# Patient Record
Sex: Female | Born: 1970
Health system: Southern US, Community
[De-identification: ages and names within clinical notes are randomized; demographics above are authoritative.]

## PROBLEM LIST (undated history)

## (undated) DIAGNOSIS — T7840XA Allergy, unspecified, initial encounter: Secondary | ICD-10-CM

## (undated) DIAGNOSIS — N809 Endometriosis, unspecified: Secondary | ICD-10-CM

## (undated) DIAGNOSIS — IMO0002 Reserved for concepts with insufficient information to code with codable children: Secondary | ICD-10-CM

## (undated) DIAGNOSIS — F32A Depression, unspecified: Secondary | ICD-10-CM

## (undated) DIAGNOSIS — H33109 Unspecified retinoschisis, unspecified eye: Secondary | ICD-10-CM

## (undated) DIAGNOSIS — F419 Anxiety disorder, unspecified: Secondary | ICD-10-CM

## (undated) DIAGNOSIS — N200 Calculus of kidney: Secondary | ICD-10-CM

## (undated) DIAGNOSIS — E785 Hyperlipidemia, unspecified: Secondary | ICD-10-CM

## (undated) DIAGNOSIS — F329 Major depressive disorder, single episode, unspecified: Secondary | ICD-10-CM

## (undated) HISTORY — PX: PELVIC LAPAROSCOPY: SHX162

## (undated) HISTORY — DX: Allergy, unspecified, initial encounter: T78.40XA

## (undated) HISTORY — DX: Calculus of kidney: N20.0

## (undated) HISTORY — DX: Reserved for concepts with insufficient information to code with codable children: IMO0002

## (undated) HISTORY — DX: Endometriosis, unspecified: N80.9

## (undated) HISTORY — PX: AUGMENTATION MAMMAPLASTY: SUR837

## (undated) HISTORY — PX: OTHER SURGICAL HISTORY: SHX169

## (undated) HISTORY — PX: WISDOM TOOTH EXTRACTION: SHX21

## (undated) HISTORY — DX: Depression, unspecified: F32.A

## (undated) HISTORY — DX: Unspecified retinoschisis, unspecified eye: H33.109

## (undated) HISTORY — DX: Hyperlipidemia, unspecified: E78.5

## (undated) HISTORY — DX: Anxiety disorder, unspecified: F41.9

## (undated) HISTORY — PX: TYMPANOSTOMY TUBE PLACEMENT: SHX32

---
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
## 1999-02-13 ENCOUNTER — Other Ambulatory Visit: Admission: RE | Admit: 1999-02-13 | Discharge: 1999-02-13 | Payer: Self-pay | Admitting: Gynecology

## 1999-04-22 ENCOUNTER — Inpatient Hospital Stay (HOSPITAL_COMMUNITY): Admission: AD | Admit: 1999-04-22 | Discharge: 1999-04-22 | Payer: Self-pay | Admitting: Gynecology

## 1999-04-23 ENCOUNTER — Inpatient Hospital Stay (HOSPITAL_COMMUNITY): Admission: AD | Admit: 1999-04-23 | Discharge: 1999-04-23 | Payer: Self-pay | Admitting: Gynecology

## 1999-08-12 ENCOUNTER — Inpatient Hospital Stay (HOSPITAL_COMMUNITY): Admission: AD | Admit: 1999-08-12 | Discharge: 1999-08-14 | Payer: Self-pay | Admitting: Gynecology

## 2000-07-26 ENCOUNTER — Encounter (INDEPENDENT_AMBULATORY_CARE_PROVIDER_SITE_OTHER): Payer: Self-pay | Admitting: Specialist

## 2000-07-26 ENCOUNTER — Ambulatory Visit: Admission: AD | Admit: 2000-07-26 | Discharge: 2000-07-26 | Payer: Self-pay | Admitting: Gynecology

## 2001-09-07 DIAGNOSIS — N809 Endometriosis, unspecified: Secondary | ICD-10-CM | POA: Insufficient documentation

## 2001-10-20 ENCOUNTER — Inpatient Hospital Stay (HOSPITAL_COMMUNITY): Admission: AD | Admit: 2001-10-20 | Discharge: 2001-10-22 | Payer: Self-pay | Admitting: Gynecology

## 2001-10-20 ENCOUNTER — Encounter (INDEPENDENT_AMBULATORY_CARE_PROVIDER_SITE_OTHER): Payer: Self-pay

## 2001-12-07 ENCOUNTER — Other Ambulatory Visit: Admission: RE | Admit: 2001-12-07 | Discharge: 2001-12-07 | Payer: Self-pay | Admitting: Obstetrics and Gynecology

## 2003-01-17 ENCOUNTER — Other Ambulatory Visit: Admission: RE | Admit: 2003-01-17 | Discharge: 2003-01-17 | Payer: Self-pay | Admitting: Obstetrics and Gynecology

## 2004-01-28 ENCOUNTER — Other Ambulatory Visit: Admission: RE | Admit: 2004-01-28 | Discharge: 2004-01-28 | Payer: Self-pay | Admitting: Obstetrics and Gynecology

## 2005-02-18 ENCOUNTER — Other Ambulatory Visit: Admission: RE | Admit: 2005-02-18 | Discharge: 2005-02-18 | Payer: Self-pay | Admitting: Obstetrics and Gynecology

## 2006-03-01 ENCOUNTER — Other Ambulatory Visit: Admission: RE | Admit: 2006-03-01 | Discharge: 2006-03-01 | Payer: Self-pay | Admitting: Obstetrics and Gynecology

## 2007-03-03 ENCOUNTER — Other Ambulatory Visit: Admission: RE | Admit: 2007-03-03 | Discharge: 2007-03-03 | Payer: Self-pay | Admitting: Obstetrics and Gynecology

## 2008-03-06 ENCOUNTER — Other Ambulatory Visit: Admission: RE | Admit: 2008-03-06 | Discharge: 2008-03-06 | Payer: Self-pay | Admitting: Obstetrics and Gynecology

## 2009-03-14 ENCOUNTER — Encounter: Payer: Self-pay | Admitting: Obstetrics and Gynecology

## 2009-03-14 ENCOUNTER — Ambulatory Visit: Payer: Self-pay | Admitting: Obstetrics and Gynecology

## 2009-03-14 ENCOUNTER — Other Ambulatory Visit: Admission: RE | Admit: 2009-03-14 | Discharge: 2009-03-14 | Payer: Self-pay | Admitting: Obstetrics and Gynecology

## 2009-12-17 ENCOUNTER — Ambulatory Visit: Payer: Self-pay | Admitting: Obstetrics and Gynecology

## 2009-12-19 ENCOUNTER — Encounter: Admission: RE | Admit: 2009-12-19 | Discharge: 2009-12-19 | Payer: Self-pay | Admitting: Obstetrics and Gynecology

## 2010-03-18 ENCOUNTER — Other Ambulatory Visit: Admission: RE | Admit: 2010-03-18 | Discharge: 2010-03-18 | Payer: Self-pay | Admitting: Obstetrics and Gynecology

## 2010-03-18 ENCOUNTER — Ambulatory Visit: Payer: Self-pay | Admitting: Obstetrics and Gynecology

## 2011-01-23 NOTE — Discharge Summary (Signed)
[facility]  Patient:    Dominique Powell, Dominique Powell Visit Number: [id number] MRN: [id number]          Service Type: OBS Location: 910A [id number] [id number] Attending Physician:  Douglass Rivers Dictated by:   Antony Contras, [facility] Admit Date:  10/20/2001 Discharge Date: 10/22/2001                             Discharge Summary  DISCHARGE DIAGNOSES:          1. Intrauterine pregnancy at 39-6/7 weeks.                               2. Spontaneous rupture of membranes.  PROCEDURE:                    Normal spontaneous vaginal delivery of viable infant over intact perineum, with repair of second-degree laceration.  HISTORY OF PRESENT ILLNESS:   The patient is a 40 year old gravida 2, para 1-0-0-1 with an LMP of Jan 13, 2001.  [facility] October 21, 2001.  PRENATAL RISK FACTORS:        History of preterm labor.  The patients husband has a history of benign renal cyst, but not polycystic.  LABS:                         Blood type 0 negative, antibody screen negative. RPR, HbSaG, HIV nonreactive.  The patient was also maintained on terbutaline 2.5 mg p.o. q.4h.  HOSPITAL COURSE/TREATMENT:    The patient was admitted on October 20 2001 at 39-6/7 weeks, with spontaneous rupture of membranes.  She did progress to complete dilatation.  Delivered an Apgar 8/8 female infant, weighing 8 pounds 2 ounces over an intact perineum, with a second-degree laceration.  POSTPARTUM COURSE:            She had no difficulty voiding and was able to be discharged in satisfactory condition on her second postpartum day.  DISCHARGE LABS:               Baby is Rh negative, so patient did not need to receive RhoGAM.  CBC -- Hematocrit 32.8, hemoglobin 11.6, WBC 13.4, platelets 221.  DISPOSITION:                  Follow-up in six weeks.  Continue with prenatal vitamins and iron.  Motrin and Tylox for pain. Dictated by:   Antony Contras, [facility] Attending Physician:  Douglass Rivers DD:  11/10/01 TD:   11/11/01 Job: [id number]

## 2011-01-23 NOTE — Op Note (Signed)
[facility]  Patient:    Dominique Powell, Dominique Powell                     MRN: [id number] Proc. Date: 07/26/00 Adm. Date:  [id number] Attending:  Sharon Mt                           Operative Report  PREOPERATIVE DIAGNOSIS: 1. Dyspareunia. 2. Vaginal lesions. 3. Endometriosis.  POSTOPERATIVE DIAGNOSIS: 1. Dyspareunia. 2. Vaginal lesions. 3. Endometriosis.  OPERATION:                    Diagnostic laparoscopy with laser vaporization of endometriosis, followed by vaginal laser vaporization of endometriosis.  SURGEON:                      Daniel L. Eda Paschal, M.D.  ASSISTANT:                    Douglass Rivers, M.D.  FINDINGS:                     At the time of laparoscopy, the patient had multiple areas of endometriosis.  Lesions were red, white and pigmented.  They were more superficial than deep, except the area involving the left uterosacral ligaments.  The patient had lesions throughout a cul-de-sac on the vaginal part of the peritoneum, several small ones on the right ovary on the broad ligament posteriorly.  Total number of areas involved were approximately 10.  In addition to all of the above lesions, the patient had a very bad area involving her left uterosacral ligament, the ureter and the sigmoid colon. The patients fallopian tubes were normal with luxuriant fimbria.  The patient had very little pelvic adhesive disease, except as noted above.  There was in addition a last area involving the sigmoid colon and the left round ligament, with active disease as well as adhesive disease.  DESCRIPTION OF PROCEDURE:     After adequate general endotracheal anesthesia, the patient was placed in the dorsolithotomy position; prepped and draped in the usual sterile manner.  The bladder was emptied with a Robinson catheter. A pneumoperitoneum was created with a Veress needle.  A 10 mm trocar was placed subumbilically; through that the operating  laparoscope attached to the camera was placed.  Two ports were placed in the pelvis (5 mm ports; one suprapubic and one left lower quadrant).  Through this a variety of instruments were placed.  The patients pelvis was systematically evaluated on each side using an irrigator/aspirator being utilized throughout the procedure.  The Nd:YAG laser was utilized with a GR6 tip on top of a Behr fiber at 10 watts power.  Areas of endometriosis were lasered, after documentation had been done with the camera.  Almost all the areas were completely laser vaporized until they were completely gone.  The area where the sigmoid colon was adherent to the ureter and the uterosacral ligament was lasered exceedingly carefully to avoid injury.  It was not felt that there had been any injury; however, because of the adherence and the fact that you could not get in retroperitoneally and free up the ureter because of the denseness of the adherence, it is possible that a small amount of disease was left on the left uterosacral ligament.  In addition, the colon was almost completely freed up from this area, but it could not be  completely freed up because the line of bowel could not be carefully followed, even lasering away from it and trying to strip the bowel off.  At termination of the procedure it was felt that at least 95% of the above had been removed.  Copious irrigation was done with Ringers lactate.  There was no bleeding noted at the termination of the procedure.  Several times with the bipolar coagulator was utilized to control bleeding.  The pneumoperitoneum was evacuated.  The fascial incisions and some above were closed with 0 Vicryl, and other incisions were closed with 4-0 Monocryl.  Attention was next turned to the vaginal lesions.  They were carefully exposed, and using the Nd:YAG lager at 8 watts power it could all be laser vaporized.  One was pigmented and the others were red.  There  were approximately 5-6 lesions.  When we were finished they were completely gone.  ESTIMATED BLOOD LOSS:         Less than 50 cc with no blood replaced.  DISPOSITION:                  The patient tolerated the procedure well and left the operating room in satisfactory condition. DD:  07/26/00 TD:  07/26/00 Job: [id number]

## 2011-03-22 ENCOUNTER — Encounter: Payer: Self-pay | Admitting: Obstetrics and Gynecology

## 2011-03-23 ENCOUNTER — Other Ambulatory Visit: Payer: Self-pay | Admitting: Obstetrics and Gynecology

## 2011-03-23 ENCOUNTER — Encounter (INDEPENDENT_AMBULATORY_CARE_PROVIDER_SITE_OTHER): Payer: 59 | Admitting: Obstetrics and Gynecology

## 2011-03-23 ENCOUNTER — Other Ambulatory Visit (HOSPITAL_COMMUNITY)
Admission: RE | Admit: 2011-03-23 | Discharge: 2011-03-23 | Disposition: A | Discharge status: Home / Self Care | Payer: 59 | Source: Ambulatory Visit | Attending: Obstetrics and Gynecology | Admitting: Obstetrics and Gynecology

## 2011-03-23 DIAGNOSIS — Z124 Encounter for screening for malignant neoplasm of cervix: Secondary | ICD-10-CM | POA: Insufficient documentation

## 2011-03-23 DIAGNOSIS — Z13220 Encounter for screening for lipoid disorders: Secondary | ICD-10-CM

## 2011-03-23 DIAGNOSIS — Z01419 Encounter for gynecological examination (general) (routine) without abnormal findings: Secondary | ICD-10-CM

## 2011-03-23 DIAGNOSIS — R82998 Other abnormal findings in urine: Secondary | ICD-10-CM

## 2011-05-13 ENCOUNTER — Other Ambulatory Visit: Payer: Self-pay | Admitting: Obstetrics and Gynecology

## 2011-05-13 DIAGNOSIS — Z1231 Encounter for screening mammogram for malignant neoplasm of breast: Secondary | ICD-10-CM

## 2011-05-18 ENCOUNTER — Other Ambulatory Visit: Payer: Self-pay | Admitting: Obstetrics and Gynecology

## 2011-05-18 ENCOUNTER — Ambulatory Visit
Admission: RE | Admit: 2011-05-18 | Discharge: 2011-05-18 | Disposition: A | Discharge status: Home / Self Care | Payer: 59 | Source: Ambulatory Visit | Attending: Obstetrics and Gynecology | Admitting: Obstetrics and Gynecology

## 2011-05-18 DIAGNOSIS — Z1231 Encounter for screening mammogram for malignant neoplasm of breast: Secondary | ICD-10-CM

## 2011-09-22 ENCOUNTER — Other Ambulatory Visit: Payer: Self-pay | Admitting: *Deleted

## 2011-09-22 MED ORDER — LEUPROLIDE ACETATE (3 MONTH) 11.25 MG IM KIT: 11.2500 mg | PACK | INTRAMUSCULAR | Status: DC

## 2011-09-22 MED ORDER — ESTRADIOL 1 MG PO TABS: 1.0000 mg | ORAL_TABLET | Freq: Every day | ORAL | Status: DC

## 2012-03-01 ENCOUNTER — Ambulatory Visit (INDEPENDENT_AMBULATORY_CARE_PROVIDER_SITE_OTHER): Payer: 59 | Admitting: Pharmacist

## 2012-03-01 ENCOUNTER — Encounter: Payer: Self-pay | Admitting: Pharmacist

## 2012-03-01 VITALS — BP 98/67 | HR 84 | Ht 64.5 in | Wt 129.9 lb

## 2012-03-01 DIAGNOSIS — N809 Endometriosis, unspecified: Secondary | ICD-10-CM

## 2012-03-01 DIAGNOSIS — J309 Allergic rhinitis, unspecified: Secondary | ICD-10-CM

## 2012-03-01 NOTE — Progress Notes (Signed)
Patient ID: [name], female   DOB: 1971/05/22, 41 y.o.   MRN: [id number] Reviewed and agree with Dr. Macky Lower management

## 2012-03-01 NOTE — Progress Notes (Signed)
  Subjective:    Patient ID: Dominique Powell, female    DOB: June 13, 1971, 41 y.o.   MRN: [id number]  HPI Patient arrives in good spirits for medication review.  Reports seeing Dr. Edyth Gunnels as primary care provider/gyneocologist. Reports being diagnosed with endometriosis for 10 years and states she is under acceptable level of control.     Review of Systems     Objective:   Physical Exam        Assessment & Plan:  Following medication review, no suggestions for change.  Complete medication list provided to patient.  Total time in face to face medication review: 20 minutes.  Patient seen with: Brett Fairy, PharmD, Pharmacy Resident.

## 2012-04-06 ENCOUNTER — Encounter: Payer: Self-pay | Admitting: Gynecology

## 2012-04-06 DIAGNOSIS — N200 Calculus of kidney: Secondary | ICD-10-CM | POA: Insufficient documentation

## 2012-04-06 DIAGNOSIS — H33109 Unspecified retinoschisis, unspecified eye: Secondary | ICD-10-CM | POA: Insufficient documentation

## 2012-04-06 DIAGNOSIS — IMO0002 Reserved for concepts with insufficient information to code with codable children: Secondary | ICD-10-CM | POA: Insufficient documentation

## 2012-04-11 ENCOUNTER — Encounter: Payer: 59 | Admitting: Obstetrics and Gynecology

## 2012-04-14 ENCOUNTER — Other Ambulatory Visit: Payer: Self-pay | Admitting: Obstetrics and Gynecology

## 2012-04-14 ENCOUNTER — Telehealth: Payer: Self-pay | Admitting: Obstetrics and Gynecology

## 2012-04-14 ENCOUNTER — Ambulatory Visit (INDEPENDENT_AMBULATORY_CARE_PROVIDER_SITE_OTHER): Payer: 59 | Admitting: Obstetrics and Gynecology

## 2012-04-14 ENCOUNTER — Encounter: Payer: Self-pay | Admitting: Obstetrics and Gynecology

## 2012-04-14 VITALS — BP 112/64 | Ht 64.0 in | Wt 126.0 lb

## 2012-04-14 DIAGNOSIS — Z3049 Encounter for surveillance of other contraceptives: Secondary | ICD-10-CM

## 2012-04-14 DIAGNOSIS — Z01419 Encounter for gynecological examination (general) (routine) without abnormal findings: Secondary | ICD-10-CM

## 2012-04-14 LAB — CBC WITH DIFFERENTIAL/PLATELET
Basophils Absolute: 0 10*3/uL (ref 0.0–0.1)
Eosinophils Absolute: 0.1 10*3/uL (ref 0.0–0.7)
Lymphocytes Relative: 37 % (ref 12–46)
Lymphs Abs: 1.5 10*3/uL (ref 0.7–4.0)
MCH: 30.5 pg (ref 26.0–34.0)
MCHC: 34.7 g/dL (ref 30.0–36.0)
Neutro Abs: 2 10*3/uL (ref 1.7–7.7)

## 2012-04-14 MED ORDER — LEVONORGESTREL 20 MCG/24HR IU IUD: INTRAUTERINE_SYSTEM | Freq: Once | INTRAUTERINE | Status: DC

## 2012-04-14 NOTE — Patient Instructions (Signed)
We will let you know when you're approved for a Mirena IUD.

## 2012-04-14 NOTE — Telephone Encounter (Signed)
Patient informed that insurance covers Mirena IUD 100% but the insertion and removal are applied to her deductible.  She has a $400 ded and has met -0-.  Her cost for the visit will be the insertion fee of $220.  Appt scheduled.

## 2012-04-14 NOTE — Progress Notes (Signed)
Patient came to see me today for her annual GYN exam. She has now been on Depo-Lupron with add back estrogen for approximately 6 years. It has kept her asymptomatic from her endometriosis which was dysmenorrhea, menorrhagia and dyspareunia due to vaginal implants at the top of the vagina. She is also used a Mirena IUD for birth control. It is actually overdue to be changed. She is giving some thought to stopping the Lupron to see if her symptoms persist. She has noticed some low diminished libido and vaginal dryness with the Lupron. She wanted my opinion. She is having no vaginal bleeding. She is having no pelvic pain. She is up-to-date on mammograms. She has never had an abnormal Pap smear. Her last Pap smear was 2012.  Physical examination: Kennon Portela present. HEENT within normal limits. Neck: Thyroid not large. No masses. Supraclavicular nodes: not enlarged. Breasts: Examined in both sitting and lying  position. No skin changes and no masses. Abdomen: Soft no guarding rebound or masses or hernia. Pelvic: External: Within normal limits except for 3 mm sebaceous cyst on left labia which is stable. BUS: Within normal limits. Vaginal:within normal limits. Good estrogen effect. No evidence of cystocele rectocele or enterocele. Cervix: clean IUD string visible. Uterus: Normal size and shape. Adnexa: No masses. Rectovaginal exam: Confirmatory and negative. Extremities: Within normal limits.  Assessment: Endometriosis-asymptomatic on Depo-Lupron  Plan: Discussed options. We have elected to stop her Depo-Lupron and add back and replaced her Mirena IUD. Hopefully this will be enough to keep her asymptomatic. If not we could either reinitiate Depo-Lupron or consider hysterectomy. Continue yearly mammograms.The new Pap smear guidelines were discussed with the patient. No pap done.

## 2012-04-15 ENCOUNTER — Other Ambulatory Visit: Payer: Self-pay | Admitting: Obstetrics and Gynecology

## 2012-04-15 DIAGNOSIS — E7800 Pure hypercholesterolemia, unspecified: Secondary | ICD-10-CM

## 2012-04-15 LAB — URINALYSIS W MICROSCOPIC + REFLEX CULTURE
Bacteria, UA: NONE SEEN
Bilirubin Urine: NEGATIVE
Casts: NONE SEEN
Crystals: NONE SEEN
Glucose, UA: NEGATIVE mg/dL
Hgb urine dipstick: NEGATIVE
Protein, ur: NEGATIVE mg/dL
Squamous Epithelial / LPF: NONE SEEN
Urobilinogen, UA: 0.2 mg/dL (ref 0.0–1.0)

## 2012-05-03 ENCOUNTER — Other Ambulatory Visit: Payer: Self-pay | Admitting: Obstetrics and Gynecology

## 2012-05-03 ENCOUNTER — Ambulatory Visit (INDEPENDENT_AMBULATORY_CARE_PROVIDER_SITE_OTHER): Payer: 59 | Admitting: Obstetrics and Gynecology

## 2012-05-03 ENCOUNTER — Encounter: Payer: Self-pay | Admitting: Obstetrics and Gynecology

## 2012-05-03 DIAGNOSIS — Z30431 Encounter for routine checking of intrauterine contraceptive device: Secondary | ICD-10-CM

## 2012-05-03 NOTE — Patient Instructions (Addendum)
Return in 6 weeks for follow up

## 2012-05-03 NOTE — Progress Notes (Signed)
Patient came back today to have me remove her old Mirena IUD which is overdue to be removed and insert a new IUD. Her last insertion was uncomfortable due to a severely retroverted uterus so she was given a paracervical block with 20 cc 1% plain Xylocaine. The cervix was prepped with Betadine. The block was excellent. The old Mirena IUD was removed with ease. A new Mirena IUD was inserted without difficulty. The patient will return in 6 weeks to be sure all is in place.

## 2012-05-23 ENCOUNTER — Telehealth: Payer: Self-pay | Admitting: *Deleted

## 2012-05-23 MED ORDER — ESTRADIOL 1 MG PO TABS: 1.0000 mg | ORAL_TABLET | Freq: Every day | ORAL | Status: DC

## 2012-05-23 NOTE — Telephone Encounter (Signed)
rx sent. Pt informed with the below

## 2012-05-23 NOTE — Addendum Note (Signed)
Addended by: Aura Camps on: 05/23/2012 11:54 AM   Modules accepted: Orders

## 2012-05-23 NOTE — Telephone Encounter (Signed)
Give her estradiol 1 mg tablets-1 daily.

## 2012-05-23 NOTE — Telephone Encounter (Signed)
Pt is calling requesting to start back on estadiol due to increase hot flashes, lack of sleep at night. Pt is requesting a month in half of estradiol. Pt feels like this will help with the above. She is only requesting this amount because she no longer receives Lupron injection last shot on (June 5th).Please advise

## 2012-05-23 NOTE — Telephone Encounter (Signed)
Pt thought it was estradiol 1 mg tablet?

## 2012-05-23 NOTE — Telephone Encounter (Signed)
Did you mean 0.5mg m estradiol?

## 2012-06-09 ENCOUNTER — Other Ambulatory Visit: Payer: Self-pay | Admitting: Obstetrics and Gynecology

## 2012-06-09 DIAGNOSIS — Z1231 Encounter for screening mammogram for malignant neoplasm of breast: Secondary | ICD-10-CM

## 2012-06-16 ENCOUNTER — Ambulatory Visit: Payer: 59 | Admitting: Obstetrics and Gynecology

## 2012-06-20 ENCOUNTER — Ambulatory Visit (INDEPENDENT_AMBULATORY_CARE_PROVIDER_SITE_OTHER): Payer: 59 | Admitting: Obstetrics and Gynecology

## 2012-06-20 DIAGNOSIS — N809 Endometriosis, unspecified: Secondary | ICD-10-CM

## 2012-06-20 NOTE — Progress Notes (Signed)
Patient came back to see me today. We replaced a Mirena IUD and this was a six-week check. She had gotten her last Depo-Lupron for endometriosis on 03/01/2012. She stopped her estradiol but started to get hot flashes again. She was restarted short-term until the effects of the Depo-Lupron wear off.  Exam: Kennon Portela present.Pelvic exam: External within normal limits. BUS within normal limits. Vaginal exam within normal limits. Cervix is clean without lesions. IUD string visible. Uterus is normal size and shape. Adnexa failed to reveal masses. Rectovaginal examination is confirmatory and without masses.   Assessment: Endometriosis  Plan: We will see how she does without Depo-Lupron. She will call for Depo-Lupron when necessary. She will call for more estradiol when necessary.

## 2012-07-05 ENCOUNTER — Ambulatory Visit
Admission: RE | Admit: 2012-07-05 | Discharge: 2012-07-05 | Disposition: A | Discharge status: Home / Self Care | Payer: 59 | Source: Ambulatory Visit | Attending: Obstetrics and Gynecology | Admitting: Obstetrics and Gynecology

## 2012-07-05 DIAGNOSIS — Z1231 Encounter for screening mammogram for malignant neoplasm of breast: Secondary | ICD-10-CM

## 2012-07-08 ENCOUNTER — Other Ambulatory Visit: Payer: Self-pay | Admitting: Obstetrics and Gynecology

## 2012-07-11 ENCOUNTER — Other Ambulatory Visit: Payer: Self-pay | Admitting: *Deleted

## 2012-07-11 DIAGNOSIS — N6489 Other specified disorders of breast: Secondary | ICD-10-CM

## 2012-07-18 ENCOUNTER — Ambulatory Visit
Admission: RE | Admit: 2012-07-18 | Discharge: 2012-07-18 | Disposition: A | Discharge status: Home / Self Care | Payer: 59 | Source: Ambulatory Visit | Attending: Obstetrics and Gynecology | Admitting: Obstetrics and Gynecology

## 2012-07-18 DIAGNOSIS — N6489 Other specified disorders of breast: Secondary | ICD-10-CM

## 2012-07-21 ENCOUNTER — Telehealth: Payer: Self-pay | Admitting: *Deleted

## 2012-07-21 MED ORDER — ESTRADIOL 1 MG PO TABS: 1.0000 mg | ORAL_TABLET | Freq: Every day | ORAL | Status: DC

## 2012-07-21 NOTE — Telephone Encounter (Signed)
Start estradiol 1 mg daily.

## 2012-07-21 NOTE — Telephone Encounter (Signed)
Pt calling follow up last OV 06/20/12. She is request more estradiol she has been off for about two weeks now and c/o hot flashes and night sweats and no sleep. Please advise

## 2012-07-21 NOTE — Addendum Note (Signed)
Addended by: Aura Camps on: 07/21/2012 10:34 AM   Modules accepted: Orders

## 2012-07-21 NOTE — Telephone Encounter (Signed)
Pt informed with below, rx sent 

## 2012-08-11 ENCOUNTER — Telehealth: Payer: Self-pay | Admitting: *Deleted

## 2012-08-11 MED ORDER — VALACYCLOVIR HCL 500 MG PO TABS: ORAL_TABLET | ORAL | Status: DC

## 2012-08-11 NOTE — Addendum Note (Signed)
Addended by: Aura Camps on: 08/11/2012 04:22 PM   Modules accepted: Orders

## 2012-08-11 NOTE — Telephone Encounter (Signed)
Pt informed, rx sent 

## 2012-08-11 NOTE — Telephone Encounter (Signed)
Valtrex 500 mg twice a day for 5 days.

## 2012-08-11 NOTE — Telephone Encounter (Signed)
Pt calling requesting Rx for fever blister, pt said that you would normally give her valtrex. Please advise

## 2012-09-02 ENCOUNTER — Ambulatory Visit (INDEPENDENT_AMBULATORY_CARE_PROVIDER_SITE_OTHER): Payer: 59 | Admitting: Obstetrics and Gynecology

## 2012-09-02 DIAGNOSIS — R102 Pelvic and perineal pain: Secondary | ICD-10-CM

## 2012-09-02 DIAGNOSIS — N949 Unspecified condition associated with female genital organs and menstrual cycle: Secondary | ICD-10-CM

## 2012-09-02 NOTE — Patient Instructions (Signed)
Schedule ultrasound for next week.

## 2012-09-02 NOTE — Progress Notes (Signed)
The patient stopped her Depo-Lupron in June which she has been on for a number of years for treatment of symptomatic endometriosis. She had to continue oral estradiol until 3 weeks ago for control of vasomotor symptoms. We had put a new Mirena IUD in August, 2013. She is now complaining of intermittent spotting, abdominal bloating and occasional pains similar to when she had active endometriosis. She still has some discomfort with intercourse but there is been no change.  Exam: Dominique Powell present.Pelvic exam: External: Within normal limits. BUS within normal limits. Vaginal exam: Within normal limits. Cervix: Clean. IUD string visible. Uterus: Within normal limits. Adnexa: Within normal limits. Rectovaginal exam: Within normal limits.   Assessment: Possible reactivation of endometriosis.  Plan: Pelvic ultrasound. Discussed options of reinitiating endometriosis treatment with Depo-Lupron or definitive surgery.

## 2012-09-05 ENCOUNTER — Ambulatory Visit (INDEPENDENT_AMBULATORY_CARE_PROVIDER_SITE_OTHER): Payer: 59 | Admitting: Obstetrics and Gynecology

## 2012-09-05 ENCOUNTER — Ambulatory Visit (INDEPENDENT_AMBULATORY_CARE_PROVIDER_SITE_OTHER): Payer: 59

## 2012-09-05 ENCOUNTER — Other Ambulatory Visit: Payer: Self-pay | Admitting: Obstetrics and Gynecology

## 2012-09-05 ENCOUNTER — Encounter: Payer: Self-pay | Admitting: Obstetrics and Gynecology

## 2012-09-05 DIAGNOSIS — N809 Endometriosis, unspecified: Secondary | ICD-10-CM

## 2012-09-05 DIAGNOSIS — R102 Pelvic and perineal pain: Secondary | ICD-10-CM

## 2012-09-05 DIAGNOSIS — N854 Malposition of uterus: Secondary | ICD-10-CM

## 2012-09-05 DIAGNOSIS — N949 Unspecified condition associated with female genital organs and menstrual cycle: Secondary | ICD-10-CM

## 2012-09-05 DIAGNOSIS — N83209 Unspecified ovarian cyst, unspecified side: Secondary | ICD-10-CM

## 2012-09-05 DIAGNOSIS — Z8742 Personal history of other diseases of the female genital tract: Secondary | ICD-10-CM

## 2012-09-05 DIAGNOSIS — N83202 Unspecified ovarian cyst, left side: Secondary | ICD-10-CM

## 2012-09-05 DIAGNOSIS — O34519 Maternal care for incarceration of gravid uterus, unspecified trimester: Secondary | ICD-10-CM

## 2012-09-05 DIAGNOSIS — N839 Noninflammatory disorder of ovary, fallopian tube and broad ligament, unspecified: Secondary | ICD-10-CM

## 2012-09-05 MED ORDER — LEUPROLIDE ACETATE (3 MONTH) 11.25 MG IM KIT: 11.2500 mg | PACK | INTRAMUSCULAR | Status: DC

## 2012-09-05 MED ORDER — ESTRADIOL 1 MG PO TABS: 1.0000 mg | ORAL_TABLET | Freq: Every day | ORAL | Status: DC

## 2012-09-05 NOTE — Progress Notes (Signed)
Patient came back to see me today for pelvic ultrasound to rule out recurrent endometriosis. On ultrasound the uterus is normal. Her endometrial echo is thin at 2.3 mm. Her IUD is identified in the endometrial cavity. It is difficult to identify the left arm of the IUD because the uterus is tilted. Her right ovary is normal. On her left ovary is a thin walled echo free, avascular cyst of 2.6 cm. Her cul-de-sac is free of fluid.  Assessment: Endometriosis  Plan: Discussed the above. In line With a discussion at her last visit we have elected to reinitiate Depo-Lupron rather than consider surgery. She will do Depo-Lupron 11.25 mg IM every 3 months. She will reinitiate estradiol 1 mg daily. She will return in 3 months for followup ultrasound for stability of left ovarian cyst.

## 2012-09-05 NOTE — Patient Instructions (Addendum)
Restart Depo-Lupron and estradiol. Schedule ultrasound in 3 months.

## 2012-11-01 ENCOUNTER — Other Ambulatory Visit: Payer: Self-pay | Admitting: Women's Health

## 2012-11-01 DIAGNOSIS — N83209 Unspecified ovarian cyst, unspecified side: Secondary | ICD-10-CM

## 2012-11-28 ENCOUNTER — Encounter: Payer: Self-pay | Admitting: Women's Health

## 2012-11-28 ENCOUNTER — Ambulatory Visit (INDEPENDENT_AMBULATORY_CARE_PROVIDER_SITE_OTHER): Payer: 59

## 2012-11-28 ENCOUNTER — Ambulatory Visit (INDEPENDENT_AMBULATORY_CARE_PROVIDER_SITE_OTHER): Payer: 59 | Admitting: Women's Health

## 2012-11-28 DIAGNOSIS — N83209 Unspecified ovarian cyst, unspecified side: Secondary | ICD-10-CM

## 2012-11-28 DIAGNOSIS — N854 Malposition of uterus: Secondary | ICD-10-CM

## 2012-11-28 DIAGNOSIS — N83202 Unspecified ovarian cyst, left side: Secondary | ICD-10-CM

## 2012-11-28 NOTE — Progress Notes (Signed)
Patient ID: Dominique Powell, female   DOB: 10-07-1970, 42 y.o.   MRN: [id number] Presents for followup ultrasound. Ultrasound 08/2012 showed a left ovarian thin-walled 26 mm cyst. Mirena IUD placed 04/2012 last dose of Lupron 08/2012. Without complaint today.  Ultrasound: Retroverted uterus with IUD seen in normal position. Right ovary normal. Left ovary normal. Previous cysts not seen. Negative cul-de-sac.  Results left ovarian cyst Endometriosis  Plan: Continue Lupron, return to office as needed.

## 2013-06-13 ENCOUNTER — Other Ambulatory Visit: Payer: Self-pay

## 2013-06-13 DIAGNOSIS — Z1231 Encounter for screening mammogram for malignant neoplasm of breast: Secondary | ICD-10-CM

## 2013-06-22 ENCOUNTER — Encounter: Payer: Self-pay | Admitting: Women's Health

## 2013-06-22 ENCOUNTER — Ambulatory Visit (INDEPENDENT_AMBULATORY_CARE_PROVIDER_SITE_OTHER): Payer: 59 | Admitting: Women's Health

## 2013-06-22 ENCOUNTER — Other Ambulatory Visit (HOSPITAL_COMMUNITY)
Admission: RE | Admit: 2013-06-22 | Discharge: 2013-06-22 | Disposition: A | Discharge status: Home / Self Care | Payer: 59 | Source: Ambulatory Visit | Attending: Gynecology | Admitting: Gynecology

## 2013-06-22 VITALS — BP 106/62 | Ht 64.0 in | Wt 129.0 lb

## 2013-06-22 DIAGNOSIS — Z13220 Encounter for screening for lipoid disorders: Secondary | ICD-10-CM

## 2013-06-22 DIAGNOSIS — Z01419 Encounter for gynecological examination (general) (routine) without abnormal findings: Secondary | ICD-10-CM

## 2013-06-22 DIAGNOSIS — N809 Endometriosis, unspecified: Secondary | ICD-10-CM

## 2013-06-22 DIAGNOSIS — Z833 Family history of diabetes mellitus: Secondary | ICD-10-CM

## 2013-06-22 LAB — LIPID PANEL
HDL: 80 mg/dL (ref >39)
LDL Cholesterol: 105 mg/dL — ABNORMAL HIGH (ref 0–99)

## 2013-06-22 LAB — GLUCOSE, RANDOM: Glucose, Bld: 86 mg/dL (ref 70–99)

## 2013-06-22 LAB — CBC WITH DIFFERENTIAL/PLATELET
Basophils Absolute: 0.1 10*3/uL (ref 0.0–0.1)
Eosinophils Absolute: 0.2 10*3/uL (ref 0.0–0.7)
HCT: 39.9 % (ref 36.0–46.0)
Hemoglobin: 13.9 g/dL (ref 12.0–15.0)
Lymphocytes Relative: 30 % (ref 12–46)
Lymphs Abs: 1.4 10*3/uL (ref 0.7–4.0)
MCHC: 34.8 g/dL (ref 30.0–36.0)
MCV: 89.5 fL (ref 78.0–100.0)
Neutrophils Relative %: 55 % (ref 43–77)

## 2013-06-22 MED ORDER — LEUPROLIDE ACETATE (3 MONTH) 11.25 MG IM KIT: 11.2500 mg | PACK | INTRAMUSCULAR | Status: DC

## 2013-06-22 MED ORDER — ESTRADIOL 1 MG PO TABS: 1.0000 mg | ORAL_TABLET | Freq: Every day | ORAL | Status: DC

## 2013-06-22 NOTE — Progress Notes (Signed)
Dominique Powell May 22, 1971 [id number]    History:    The patient presents for annual exam.  Amenorrheic Mirena IUD placed 04/2012 and is on Lupron every 3 months with add back estradiol 1 mg daily for endometriosis per Dr. Eda Paschal. Has had good relief of symptoms on Lupron. Had taken a break from the Lupron April 2013 and had numerous symptoms of pain and GI issues. Normal Pap and mammogram history.  Past medical history, past surgical history, family history and social history were all reviewed and documented in the EPIC chart. Nurse at [employer]. [name] 13, Evan 10 both doing well. [name] showing some symptoms of endometriosis. Mother endometriosis. Husband kidney disease.   ROS:  A  ROS was performed and pertinent positives and negatives are included in the history.  Exam:  Filed Vitals:   06/22/13 0904  BP: 106/62    General appearance:  Normal Head/Neck:  Normal, without cervical or supraclavicular adenopathy. Thyroid:  Symmetrical, normal in size, without palpable masses or nodularity. Respiratory  Effort:  Normal  Auscultation:  Clear without wheezing or rhonchi Cardiovascular  Auscultation:  Regular rate, without rubs, murmurs or gallops  Edema/varicosities:  Not grossly evident Abdominal  Soft,nontender, without masses, guarding or rebound.  Liver/spleen:  No organomegaly noted  Hernia:  None appreciated  Skin  Inspection:  Grossly normal  Palpation:  Grossly normal Neurologic/psychiatric  Orientation:  Normal with appropriate conversation.  Mood/affect:  Normal  Genitourinary    Breasts: Examined lying and sitting/bilateral implants.     Right: Without masses, retractions, discharge or axillary adenopathy.     Left: Without masses, retractions, discharge or axillary adenopathy.   Inguinal/mons:  Normal without inguinal adenopathy  External genitalia:  Normal  BUS/Urethra/Skene's glands:  Normal  Bladder:  Normal  Vagina:  Normal  Cervix:  Normal IUD strings  visible  Uterus:  Retroverted, normal in size, shape and contour.  Midline and mobile  Adnexa/parametria:     Rt: Without masses or tenderness.   Lt: Without masses or tenderness.  Anus and perineum: Normal  Digital rectal exam: Normal sphincter tone without palpated masses or tenderness  Assessment/Plan:  42 y.o. M. WF G1 repeat for annual exam.     Endometriosis/pain-free Mirena IUD with Lupron and estradiol.  Plan: SBE's, continue annual mammogram, given her exercise, calcium rich diet, vitamin D 2000 daily encouraged. Lupron 11.25 every 12 weeks, estradiol 1 mg daily. Reviewed options of drug holiday, hysterectomy, would like to continue same regimen. CBC, glucose, lipid panel, UA, Pap. Pap normal 2012, new screening guidelines reviewed.    [facility], 10:13 AM 06/22/2013

## 2013-06-22 NOTE — Patient Instructions (Signed)

## 2013-06-23 LAB — URINALYSIS W MICROSCOPIC + REFLEX CULTURE
Casts: NONE SEEN
Crystals: NONE SEEN
Glucose, UA: NEGATIVE mg/dL
Hgb urine dipstick: NEGATIVE
Nitrite: NEGATIVE
Specific Gravity, Urine: 1.019 (ref 1.005–1.030)
pH: 5.5 (ref 5.0–8.0)

## 2013-06-24 LAB — URINE CULTURE
Colony Count: NO GROWTH
Organism ID, Bacteria: NO GROWTH

## 2013-07-07 ENCOUNTER — Ambulatory Visit
Admission: RE | Admit: 2013-07-07 | Discharge: 2013-07-07 | Disposition: A | Discharge status: Home / Self Care | Payer: 59 | Source: Ambulatory Visit

## 2013-07-07 DIAGNOSIS — Z1231 Encounter for screening mammogram for malignant neoplasm of breast: Secondary | ICD-10-CM

## 2013-07-13 ENCOUNTER — Other Ambulatory Visit: Payer: Self-pay

## 2014-06-20 ENCOUNTER — Other Ambulatory Visit: Payer: Self-pay

## 2014-06-20 DIAGNOSIS — Z1231 Encounter for screening mammogram for malignant neoplasm of breast: Secondary | ICD-10-CM

## 2014-06-26 ENCOUNTER — Encounter: Payer: Self-pay | Admitting: Women's Health

## 2014-06-26 ENCOUNTER — Ambulatory Visit (INDEPENDENT_AMBULATORY_CARE_PROVIDER_SITE_OTHER): Payer: 59 | Admitting: Women's Health

## 2014-06-26 VITALS — BP 110/74 | Ht 64.0 in | Wt 134.0 lb

## 2014-06-26 DIAGNOSIS — E079 Disorder of thyroid, unspecified: Secondary | ICD-10-CM

## 2014-06-26 DIAGNOSIS — Z833 Family history of diabetes mellitus: Secondary | ICD-10-CM

## 2014-06-26 DIAGNOSIS — Z13220 Encounter for screening for lipoid disorders: Secondary | ICD-10-CM

## 2014-06-26 DIAGNOSIS — Z01419 Encounter for gynecological examination (general) (routine) without abnormal findings: Secondary | ICD-10-CM

## 2014-06-26 LAB — LIPID PANEL
CHOLESTEROL: 199 mg/dL (ref 0–200)
HDL: 76 mg/dL (ref >39)
LDL Cholesterol: 112 mg/dL — ABNORMAL HIGH (ref 0–99)
TRIGLYCERIDES: 54 mg/dL (ref <150)
Total CHOL/HDL Ratio: 2.6 Ratio
VLDL: 11 mg/dL (ref 0–40)

## 2014-06-26 LAB — CBC WITH DIFFERENTIAL/PLATELET
Basophils Absolute: 0.1 10*3/uL (ref 0.0–0.1)
Basophils Relative: 1 % (ref 0–1)
Eosinophils Absolute: 0.2 10*3/uL (ref 0.0–0.7)
Eosinophils Relative: 3 % (ref 0–5)
HCT: 39.9 % (ref 36.0–46.0)
HEMOGLOBIN: 13.5 g/dL (ref 12.0–15.0)
LYMPHS ABS: 1.6 10*3/uL (ref 0.7–4.0)
LYMPHS PCT: 31 % (ref 12–46)
MCH: 30.5 pg (ref 26.0–34.0)
MCHC: 33.8 g/dL (ref 30.0–36.0)
MCV: 90.3 fL (ref 78.0–100.0)
MONOS PCT: 7 % (ref 3–12)
Monocytes Absolute: 0.4 10*3/uL (ref 0.1–1.0)
NEUTROS PCT: 58 % (ref 43–77)
Neutro Abs: 3 10*3/uL (ref 1.7–7.7)
Platelets: 277 10*3/uL (ref 150–400)
RBC: 4.42 MIL/uL (ref 3.87–5.11)
RDW: 12.8 % (ref 11.5–15.5)
WBC: 5.1 10*3/uL (ref 4.0–10.5)

## 2014-06-26 LAB — TSH: TSH: 2.133 u[IU]/mL (ref 0.350–4.500)

## 2014-06-26 LAB — GLUCOSE, RANDOM: GLUCOSE: 93 mg/dL (ref 70–99)

## 2014-06-26 NOTE — Progress Notes (Signed)
Dominique Powell 04/01/1980 [id number]    History:    Presents for annual exam.  Rare spotting Mirena IUD placed 04/2012. Long-term history of endometriosis had been on Lupron every 3 months with add back estradiol 1 mg daily for years, off past year. Pain is manageable but has had increased headaches over past year. Evaluated by ENT with no relief.  Normal Pap and mammogram history..  Past medical history, past surgical history, family history and social history were all reviewed and documented in the EPIC chart. 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 [name] 14, Evan 11 both doing well.  ROS:  A  12 point ROS was performed and pertinent positives and negatives are included.  Exam:  Filed Vitals:   06/26/14 1102  BP: 110/74    General appearance:  Normal Thyroid:  Symmetrical, normal in size, without palpable masses or nodularity. Respiratory  Auscultation:  Clear without wheezing or rhonchi Cardiovascular  Auscultation:  Regular rate, without rubs, murmurs or gallops  Edema/varicosities:  Not grossly evident Abdominal  Soft,nontender, without masses, guarding or rebound.  Liver/spleen:  No organomegaly noted  Hernia:  None appreciated  Skin  Inspection:  Grossly normal   Breasts: Examined lying and sitting/bilateral implants.     Right: Without masses, retractions, discharge or axillary adenopathy.     Left: Without masses, retractions, discharge or axillary adenopathy. Gentitourinary   Inguinal/mons:  Normal without inguinal adenopathy  External genitalia:  Normal  BUS/Urethra/Skene's glands:  Normal  Vagina:  Normal  Cervix:  Normal IUD strings visible  Uterus:  retroverted, normal in size, shape and contour.  Midline and mobile  Adnexa/parametria:     Rt: Without masses or tenderness.   Lt: Without masses or tenderness.  Anus and perineum: Normal  Digital rectal exam: Normal sphincter tone without palpated masses or tenderness  Assessment/Plan:   43 y.o. MWF G2P2 for annual exam.     Intermittent right-sided headaches Endometriosis/stable Mirena IUD placed 04/2012  Plan: Instructed to schedule appointment with Dr. Earley Favor or Dr. Riley Nearing for headache evaluation. Will call if problems with scheduling. SBE's, continue annual 3-D tomography, history of dense breast. Encouraged regular exercise, calcium rich diet, vitamin D 1000 daily. CBC, lipid panel, glucose UA, Pap normal 2014, new screening guidelines reviewed.     Huel Cote [facility], 12:03 PM 06/26/2014

## 2014-06-26 NOTE — Patient Instructions (Signed)

## 2014-06-27 ENCOUNTER — Encounter: Payer: Self-pay | Admitting: Women's Health

## 2014-06-27 LAB — URINALYSIS W MICROSCOPIC + REFLEX CULTURE
Bacteria, UA: NONE SEEN
Bilirubin Urine: NEGATIVE
Casts: NONE SEEN
Crystals: NONE SEEN
GLUCOSE, UA: NEGATIVE mg/dL
HGB URINE DIPSTICK: NEGATIVE
Ketones, ur: NEGATIVE mg/dL
LEUKOCYTES UA: NEGATIVE
Nitrite: NEGATIVE
PROTEIN: NEGATIVE mg/dL
Specific Gravity, Urine: 1.02 (ref 1.005–1.030)
UROBILINOGEN UA: 0.2 mg/dL (ref 0.0–1.0)
pH: 6 (ref 5.0–8.0)

## 2014-07-09 ENCOUNTER — Encounter: Payer: Self-pay | Admitting: Women's Health

## 2014-07-16 ENCOUNTER — Ambulatory Visit
Admission: RE | Admit: 2014-07-16 | Discharge: 2014-07-16 | Disposition: A | Discharge status: Home / Self Care | Payer: 59 | Source: Ambulatory Visit

## 2014-07-16 DIAGNOSIS — Z1231 Encounter for screening mammogram for malignant neoplasm of breast: Secondary | ICD-10-CM

## 2014-07-17 ENCOUNTER — Encounter: Payer: Self-pay | Admitting: Women's Health

## 2014-09-27 ENCOUNTER — Encounter: Payer: Self-pay | Admitting: Women's Health

## 2014-09-27 ENCOUNTER — Ambulatory Visit (INDEPENDENT_AMBULATORY_CARE_PROVIDER_SITE_OTHER): Payer: 59 | Admitting: Women's Health

## 2014-09-27 VITALS — BP 120/78 | Ht 64.0 in | Wt 134.0 lb

## 2014-09-27 DIAGNOSIS — F4322 Adjustment disorder with anxiety: Secondary | ICD-10-CM

## 2014-09-27 MED ORDER — ALPRAZOLAM 0.25 MG PO TABS: 0.2500 mg | ORAL_TABLET | Freq: Every evening | ORAL | Status: DC | PRN

## 2014-09-27 MED ORDER — ESCITALOPRAM OXALATE 10 MG PO TABS: 10.0000 mg | ORAL_TABLET | Freq: Every day | ORAL | Status: DC

## 2014-09-27 NOTE — Progress Notes (Signed)
Patient ID: Dominique Powell, female   DOB: May 03, 1971, 44 y.o.   MRN: [id number] Presents with complaint of being more emotional, tearful, retreating from situations and preferring to be alone. Denies any feelings of self-harm. Her mother suggested her seeing someone about possible medication. Is currently in counseling. Husband has chronic kidney disease in the process of bilateral nephrectomies starting on dialysis, hopes of transplant next year. Teenage daughter is doing well. Works full time as a Public affairs consultant. States feels overwhelmed at times. Sleep is interrupted. Mirena IUD placed 04/2012/amenorrheic.  Exam: Appears well.  Situational anxiety  Plan: Options reviewed. Strongly encouraged to continue counseling. Medication options reviewed. Will try Lexapro 10 mg start with half tablet increased to full tablet after 2 weeks. Xanax 0.25 as a rescue medication for sleep. Aware of addictive properties. Encouraged daily walk/regular exercise.

## 2014-09-27 NOTE — Patient Instructions (Signed)
Social Anxiety Disorder  Social anxiety disorder, previously called social phobia, is a mental disorder. People with social anxiety disorder frequently feel nervous, afraid, or embarrassed when around other people in social situations. They constantly worry that other people are judging or criticizing them for how they look, what they say, or how they act. They may worry that other people might reject them because of their appearance or behavior.  Social anxiety disorder is more than just occasional shyness or self-consciousness. It can cause severe emotional distress. It can interfere with daily life activities. Social anxiety disorder also may lead to excessive alcohol or drug use and even suicide.   Social anxiety disorder is actually one of the most common mental disorders. It can develop at any time but usually starts in the teenage years. Women are more commonly affected than men. Social anxiety disorder is also more common in people who have family members with anxiety disorders. It also is more common in people who have physical deformities or conditions with characteristics that are obvious to others, such as stuttered speech or movement abnormalities (Parkinson disease).   SYMPTOMS   In addition to feeling anxious or fearful in social situations, people with social anxiety disorder frequently have physical symptoms. Examples include:  · Red face (blushing).  · Racing heart.  · Sweating.  · Shaky hands or voice.  · Confusion.  · Light-headedness.  · Upset stomach and diarrhea.  DIAGNOSIS   Social anxiety disorder is diagnosed through an assessment by your health care provider. Your health care provider will ask you questions about your mood, thoughts, and reactions in social situations. Your health care provider may ask you about your medical history and use of alcohol or drugs, including prescription medicines. Certain medical conditions and the use of certain substances, including caffeine, can cause  symptoms similar to social anxiety disorder. Your health care provider may refer you to a mental health specialist for further evaluation or treatment.  The criteria for diagnosis of social anxiety disorder are:  · Marked fear or anxiety in one or more social situations in which you may be closely watched or studied by others. Examples of such situations include:    Interacting socially (having a conversation with others, going to a party, or meeting strangers).    Being observed (eating or drinking in public or being called on in class).    Performing in front of others (giving a speech).  · The social situations of concern almost always cause fear or anxiety, not just occasionally.  · People with social anxiety disorder fear that they will be viewed negatively in a way that will be embarrassing, will lead to rejection, or will offend others. This fear is out of proportion to the actual threat posed by the social situation.  · Often the triggering social situations are avoided, or they are endured with intense fear or anxiety. The fear, anxiety, or avoidance is persistent and lasts for 6 months or longer.  · The anxiety causes difficulty functioning in at least some parts of your daily life.  TREATMENT   Several types of treatment are available for social anxiety disorder. These treatments are often used in combination and include:   · Talk therapy. Group talk therapy allows you to see that you are not alone with these problems. Individual talk therapy helps you address your specific anxiety issues with a caring professional. The most effective forms of talk therapy for social anxiety disorder are cognitive-behavioral therapy and exposure therapy.   that you fear most.  Relaxation and coping techniques. These include deep  breathing, self-talk, meditation, visual imagery, and yoga. Relaxation techniques help to keep you calm in social situations.  Social [employer] secretary.Social skills can be learned on your own or with the help of a talk therapist. They can help you feel more confident and comfortable in social situations.  Medicine. For anxiety limited to performance situations (performance anxiety), medicine called beta blockers can help by reducing or preventing the physical symptoms of social anxiety disorder. For more persistent and generalized social anxiety, antidepressant medicine may be prescribed to help control symptoms. In severe cases of social anxiety disorder, strong antianxiety medicine, called benzodiazepines, may be prescribed on a limited basis and for a short time. Document Released: 07/23/2005 Document Revised: 01/08/2014 Document Reviewed: 11/22/2012 [facility] Patient Information 2015 [place], [place]. This information is not intended to replace advice given to you by your health care provider. Make sure you discuss any questions you have with your health care provider.

## 2015-05-20 ENCOUNTER — Telehealth: Payer: Self-pay

## 2015-05-20 NOTE — Telephone Encounter (Signed)
At last visit you prescribed Lexapro for her. You told her she may need to increase it but to see how things went.  She said lately she just felt like it was not enough. Family situation.  She increased it to 15 mg daily and has felt more even keel on that dose.  However, Rx is running out. She will need new Rx to reflect 15 mg daily so that she can get it when she needs it. Ok?

## 2015-05-21 MED ORDER — ESCITALOPRAM OXALATE 20 MG PO TABS: 20.0000 mg | ORAL_TABLET | Freq: Every day | ORAL | Status: DC

## 2015-05-21 NOTE — Telephone Encounter (Signed)
I relayed this to patient. She asked me to go ahead and call in the 20 mg. And she will decide. She said for now the 15 mg seems okay but she may want to try the 20. Rx sent. She has CE scheduled for Oct.

## 2015-05-21 NOTE — Telephone Encounter (Signed)
Ok, please escribe 15mg , tell her 20 mg is a common dose if she needs it.

## 2015-05-21 NOTE — Telephone Encounter (Signed)
Rx sent 

## 2015-06-27 ENCOUNTER — Other Ambulatory Visit: Payer: Self-pay

## 2015-06-27 DIAGNOSIS — Z1231 Encounter for screening mammogram for malignant neoplasm of breast: Secondary | ICD-10-CM

## 2015-06-28 ENCOUNTER — Encounter: Payer: 59 | Admitting: Women's Health

## 2015-07-24 ENCOUNTER — Encounter: Payer: Self-pay | Admitting: Women's Health

## 2015-07-24 ENCOUNTER — Other Ambulatory Visit (HOSPITAL_COMMUNITY)
Admission: RE | Admit: 2015-07-24 | Discharge: 2015-07-24 | Disposition: A | Discharge status: Home / Self Care | Payer: 59 | Source: Ambulatory Visit | Attending: Women's Health | Admitting: Women's Health

## 2015-07-24 ENCOUNTER — Ambulatory Visit (INDEPENDENT_AMBULATORY_CARE_PROVIDER_SITE_OTHER): Payer: 59 | Admitting: Women's Health

## 2015-07-24 VITALS — BP 126/80 | Ht 64.0 in | Wt 137.0 lb

## 2015-07-24 DIAGNOSIS — Z13220 Encounter for screening for lipoid disorders: Secondary | ICD-10-CM

## 2015-07-24 DIAGNOSIS — Z1329 Encounter for screening for other suspected endocrine disorder: Secondary | ICD-10-CM

## 2015-07-24 DIAGNOSIS — F329 Major depressive disorder, single episode, unspecified: Secondary | ICD-10-CM | POA: Diagnosis not present

## 2015-07-24 DIAGNOSIS — F418 Other specified anxiety disorders: Secondary | ICD-10-CM | POA: Diagnosis not present

## 2015-07-24 DIAGNOSIS — F32A Depression, unspecified: Secondary | ICD-10-CM

## 2015-07-24 DIAGNOSIS — F419 Anxiety disorder, unspecified: Secondary | ICD-10-CM

## 2015-07-24 DIAGNOSIS — Z1151 Encounter for screening for human papillomavirus (HPV): Secondary | ICD-10-CM | POA: Diagnosis not present

## 2015-07-24 DIAGNOSIS — Z01419 Encounter for gynecological examination (general) (routine) without abnormal findings: Secondary | ICD-10-CM | POA: Diagnosis not present

## 2015-07-24 DIAGNOSIS — F331 Major depressive disorder, recurrent, moderate: Secondary | ICD-10-CM | POA: Insufficient documentation

## 2015-07-24 LAB — TSH: TSH: 2.335 u[IU]/mL (ref 0.350–4.500)

## 2015-07-24 LAB — CBC WITH DIFFERENTIAL/PLATELET
BASOS ABS: 0.1 10*3/uL (ref 0.0–0.1)
BASOS PCT: 1 % (ref 0–1)
EOS PCT: 4 % (ref 0–5)
Eosinophils Absolute: 0.2 10*3/uL (ref 0.0–0.7)
HCT: 37 % (ref 36.0–46.0)
Hemoglobin: 12.5 g/dL (ref 12.0–15.0)
Lymphocytes Relative: 29 % (ref 12–46)
Lymphs Abs: 1.5 10*3/uL (ref 0.7–4.0)
MCH: 30.5 pg (ref 26.0–34.0)
MCHC: 33.8 g/dL (ref 30.0–36.0)
MCV: 90.2 fL (ref 78.0–100.0)
MPV: 10.7 fL (ref 8.6–12.4)
Monocytes Absolute: 0.5 10*3/uL (ref 0.1–1.0)
Monocytes Relative: 9 % (ref 3–12)
Neutro Abs: 2.9 10*3/uL (ref 1.7–7.7)
Neutrophils Relative %: 57 % (ref 43–77)
Platelets: 258 10*3/uL (ref 150–400)
RBC: 4.1 MIL/uL (ref 3.87–5.11)
RDW: 12.8 % (ref 11.5–15.5)
WBC: 5.1 10*3/uL (ref 4.0–10.5)

## 2015-07-24 LAB — LIPID PANEL
CHOL/HDL RATIO: 2.7 ratio (ref <=5.0)
Cholesterol: 191 mg/dL (ref 125–200)
HDL: 72 mg/dL (ref >=46)
LDL CALC: 110 mg/dL (ref <130)
Triglycerides: 45 mg/dL (ref <150)
VLDL: 9 mg/dL (ref <30)

## 2015-07-24 LAB — COMPREHENSIVE METABOLIC PANEL
ALK PHOS: 55 U/L (ref 33–115)
ALT: 9 U/L (ref 6–29)
AST: 16 U/L (ref 10–35)
Albumin: 4.6 g/dL (ref 3.6–5.1)
BUN: 11 mg/dL (ref 7–25)
CO2: 29 mmol/L (ref 20–31)
Calcium: 9.2 mg/dL (ref 8.6–10.2)
Chloride: 100 mmol/L (ref 98–110)
Creat: 0.67 mg/dL (ref 0.50–1.10)
GLUCOSE: 73 mg/dL (ref 65–99)
Potassium: 3.9 mmol/L (ref 3.5–5.3)
Sodium: 135 mmol/L (ref 135–146)
Total Bilirubin: 1.2 mg/dL (ref 0.2–1.2)
Total Protein: 7 g/dL (ref 6.1–8.1)

## 2015-07-24 MED ORDER — ESCITALOPRAM OXALATE 20 MG PO TABS: 20.0000 mg | ORAL_TABLET | Freq: Every day | ORAL | Status: DC

## 2015-07-24 NOTE — Patient Instructions (Signed)
Health Maintenance, Female Adopting a healthy lifestyle and getting preventive care can go a long way to promote health and wellness. Talk with your health care provider about what schedule of regular examinations is right for you. This is a good chance for you to check in with your provider about disease prevention and staying healthy. In between checkups, there are plenty of things you can do on your own. Experts have done a lot of research about which lifestyle changes and preventive measures are most likely to keep you healthy. Ask your health care provider for more information. WEIGHT AND DIET  Eat a healthy diet  Be sure to include plenty of vegetables, fruits, low-fat dairy products, and lean protein.  Do not eat a lot of foods high in solid fats, added sugars, or salt.  Get regular exercise. This is one of the most important things you can do for your health.  Most adults should exercise for at least 150 minutes each week. The exercise should increase your heart rate and make you sweat (moderate-intensity exercise).  Most adults should also do strengthening exercises at least twice a week. This is in addition to the moderate-intensity exercise.  Maintain a healthy weight  Body mass index (BMI) is a measurement that can be used to identify possible weight problems. It estimates body fat based on height and weight. Your health care provider can help determine your BMI and help you achieve or maintain a healthy weight.  For females 20 years of age and older:   A BMI below 18.5 is considered underweight.  A BMI of 18.5 to 24.9 is normal.  A BMI of 25 to 29.9 is considered overweight.  A BMI of 30 and above is considered obese.  Watch levels of cholesterol and blood lipids  You should start having your blood tested for lipids and cholesterol at 45 years of age, then have this test every 5 years.  You may need to have your cholesterol levels checked more often if:  Your lipid  or cholesterol levels are high.  You are older than 45 years of age.  You are at high risk for heart disease.  CANCER SCREENING   Lung Cancer  Lung cancer screening is recommended for adults 55-80 years old who are at high risk for lung cancer because of a history of smoking.  A yearly low-dose CT scan of the lungs is recommended for people who:  Currently smoke.  Have quit within the past 15 years.  Have at least a 30-pack-year history of smoking. A pack year is smoking an average of one pack of cigarettes a day for 1 year.  Yearly screening should continue until it has been 15 years since you quit.  Yearly screening should stop if you develop a health problem that would prevent you from having lung cancer treatment.  Breast Cancer  Practice breast self-awareness. This means understanding how your breasts normally appear and feel.  It also means doing regular breast self-exams. Let your health care provider know about any changes, no matter how small.  If you are in your 20s or 30s, you should have a clinical breast exam (CBE) by a health care provider every 1-3 years as part of a regular health exam.  If you are 40 or older, have a CBE every year. Also consider having a breast X-ray (mammogram) every year.  If you have a family history of breast cancer, talk to your health care provider about genetic screening.  If you   are at high risk for breast cancer, talk to your health care provider about having an MRI and a mammogram every year.  Breast cancer gene (BRCA) assessment is recommended for women who have family members with BRCA-related cancers. BRCA-related cancers include:  Breast.  Ovarian.  Tubal.  Peritoneal cancers.  Results of the assessment will determine the need for genetic counseling and BRCA1 and BRCA2 testing. Cervical Cancer Your health care provider may recommend that you be screened regularly for cancer of the pelvic organs (ovaries, uterus, and  vagina). This screening involves a pelvic examination, including checking for microscopic changes to the surface of your cervix (Pap test). You may be encouraged to have this screening done every 3 years, beginning at age 21.  For women ages 30-65, health care providers may recommend pelvic exams and Pap testing every 3 years, or they may recommend the Pap and pelvic exam, combined with testing for human papilloma virus (HPV), every 5 years. Some types of HPV increase your risk of cervical cancer. Testing for HPV may also be done on women of any age with unclear Pap test results.  Other health care providers may not recommend any screening for nonpregnant women who are considered low risk for pelvic cancer and who do not have symptoms. Ask your health care provider if a screening pelvic exam is right for you.  If you have had past treatment for cervical cancer or a condition that could lead to cancer, you need Pap tests and screening for cancer for at least 20 years after your treatment. If Pap tests have been discontinued, your risk factors (such as having a new sexual partner) need to be reassessed to determine if screening should resume. Some women have medical problems that increase the chance of getting cervical cancer. In these cases, your health care provider may recommend more frequent screening and Pap tests. Colorectal Cancer  This type of cancer can be detected and often prevented.  Routine colorectal cancer screening usually begins at 45 years of age and continues through 45 years of age.  Your health care provider may recommend screening at an earlier age if you have risk factors for colon cancer.  Your health care provider may also recommend using home test kits to check for hidden blood in the stool.  A small camera at the end of a tube can be used to examine your colon directly (sigmoidoscopy or colonoscopy). This is done to check for the earliest forms of colorectal  cancer.  Routine screening usually begins at age 50.  Direct examination of the colon should be repeated every 5-10 years through 45 years of age. However, you may need to be screened more often if early forms of precancerous polyps or small growths are found. Skin Cancer  Check your skin from head to toe regularly.  Tell your health care provider about any new moles or changes in moles, especially if there is a change in a mole's shape or color.  Also tell your health care provider if you have a mole that is larger than the size of a pencil eraser.  Always use sunscreen. Apply sunscreen liberally and repeatedly throughout the day.  Protect yourself by wearing long sleeves, pants, a wide-brimmed hat, and sunglasses whenever you are outside. HEART DISEASE, DIABETES, AND HIGH BLOOD PRESSURE   High blood pressure causes heart disease and increases the risk of stroke. High blood pressure is more likely to develop in:  People who have blood pressure in the high end   of the normal range (130-139/85-89 mm Hg).  People who are overweight or obese.  People who are African American.  If you are 38-23 years of age, have your blood pressure checked every 3-5 years. If you are 61 years of age or older, have your blood pressure checked every year. You should have your blood pressure measured twice--once when you are at a hospital or clinic, and once when you are not at a hospital or clinic. Record the average of the two measurements. To check your blood pressure when you are not at a hospital or clinic, you can use:  An automated blood pressure machine at a pharmacy.  A home blood pressure monitor.  If you are between 45 years and 39 years old, ask your health care provider if you should take aspirin to prevent strokes.  Have regular diabetes screenings. This involves taking a blood sample to check your fasting blood sugar level.  If you are at a normal weight and have a low risk for diabetes,  have this test once every three years after 45 years of age.  If you are overweight and have a high risk for diabetes, consider being tested at a younger age or more often. PREVENTING INFECTION  Hepatitis B  If you have a higher risk for hepatitis B, you should be screened for this virus. You are considered at high risk for hepatitis B if:  You were born in a country where hepatitis B is common. Ask your health care provider which countries are considered high risk.  Your parents were born in a high-risk country, and you have not been immunized against hepatitis B (hepatitis B vaccine).  You have HIV or AIDS.  You use needles to inject street drugs.  You live with someone who has hepatitis B.  You have had sex with someone who has hepatitis B.  You get hemodialysis treatment.  You take certain medicines for conditions, including cancer, organ transplantation, and autoimmune conditions. Hepatitis C  Blood testing is recommended for:  Everyone born from [birth year] through 1966.  Anyone with known risk factors for hepatitis C. Sexually transmitted infections (STIs)  You should be screened for sexually transmitted infections (STIs) including gonorrhea and chlamydia if:  You are sexually active and are younger than 45 years of age.  You are older than 45 years of age and your health care provider tells you that you are at risk for this type of infection.  Your sexual activity has changed since you were last screened and you are at an increased risk for chlamydia or gonorrhea. Ask your health care provider if you are at risk.  If you do not have HIV, but are at risk, it may be recommended that you take a prescription medicine daily to prevent HIV infection. This is called pre-exposure prophylaxis (PrEP). You are considered at risk if:  You are sexually active and do not regularly use condoms or know the HIV status of your partner(s).  You take drugs by injection.  You are sexually  active with a partner who has HIV. Talk with your health care provider about whether you are at high risk of being infected with HIV. If you choose to begin PrEP, you should first be tested for HIV. You should then be tested every 3 months for as long as you are taking PrEP.  PREGNANCY   If you are premenopausal and you may become pregnant, ask your health care provider about preconception counseling.  If you may  become pregnant, take 400 to 800 micrograms (mcg) of folic acid every day.  If you want to prevent pregnancy, talk to your health care provider about birth control (contraception). OSTEOPOROSIS AND MENOPAUSE   Osteoporosis is a disease in which the bones lose minerals and strength with aging. This can result in serious bone fractures. Your risk for osteoporosis can be identified using a bone density scan.  If you are 61 years of age or older, or if you are at risk for osteoporosis and fractures, ask your health care provider if you should be screened.  Ask your health care provider whether you should take a calcium or vitamin D supplement to lower your risk for osteoporosis.  Menopause may have certain physical symptoms and risks.  Hormone replacement therapy may reduce some of these symptoms and risks. Talk to your health care provider about whether hormone replacement therapy is right for you.  HOME CARE INSTRUCTIONS   Schedule regular health, dental, and eye exams.  Stay current with your immunizations.   Do not use any tobacco products including cigarettes, chewing tobacco, or electronic cigarettes.  If you are pregnant, do not drink alcohol.  If you are breastfeeding, limit how much and how often you drink alcohol.  Limit alcohol intake to no more than 1 drink per day for nonpregnant women. One drink equals 12 ounces of beer, 5 ounces of wine, or 1 ounces of hard liquor.  Do not use street drugs.  Do not share needles.  Ask your health care provider for help if  you need support or information about quitting drugs.  Tell your health care provider if you often feel depressed.  Tell your health care provider if you have ever been abused or do not feel safe at home.   This information is not intended to replace advice given to you by your health care provider. Make sure you discuss any questions you have with your health care provider.   Document Released: 03/09/2011 Document Revised: 09/14/2014 Document Reviewed: 07/26/2013 Elsevier Interactive Patient Education [employer].

## 2015-07-24 NOTE — Addendum Note (Signed)
Addended by: Burnett Kanaris on: 07/24/2015 10:30 AM   Modules accepted: Orders

## 2015-07-24 NOTE — Progress Notes (Signed)
Dominique Powell March 22, 1971 [id number]    History:    Presents for annual exam.  Amenorrheic on Mirena IUD placed 04/2012. Endometriosis Had been on Lupron every 3 months with add back therapy stopped greater than one year ago and doing well, pain free. Has had some struggles with anxiety and depression, situational on Lexapro and doing well. Daughter has a heart valve issue has follow-up with cardiologist. Husband currently on waiting list for kidney transplant on home dialysis from polycystic kidney disease. Normal Pap and mammogram history.  Past medical history, past surgical history, family history and social history were all reviewed and documented in the EPIC chart. 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  ROS:  A ROS was performed and pertinent positives and negatives are included.  Exam:  Filed Vitals:   07/24/15 0911  BP: 126/80    General appearance:  Normal Thyroid:  Symmetrical, normal in size, without palpable masses or nodularity. Respiratory  Auscultation:  Clear without wheezing or rhonchi Cardiovascular  Auscultation:  Regular rate, without rubs, murmurs or gallops  Edema/varicosities:  Not grossly evident Abdominal  Soft,nontender, without masses, guarding or rebound.  Liver/spleen:  No organomegaly noted  Hernia:  None appreciated  Skin  Inspection:  Grossly normal   Breasts: Examined lying and sitting.     Right: Without masses, retractions, discharge or axillary adenopathy.     Left: Without masses, retractions, discharge or axillary adenopathy. Gentitourinary   Inguinal/mons:  Normal without inguinal adenopathy  External genitalia:  Normal  BUS/Urethra/Skene's glands:  Normal  Vagina:  Normal  Cervix:  Normal IUD strings visible  Uterus:  normal in size, shape and contour.  Midline and mobile  Adnexa/parametria:     Rt: Without masses or tenderness.   Lt: Without masses or tenderness.  Anus and perineum: Normal  Digital rectal exam: Normal sphincter tone  without palpated masses or tenderness  Assessment/Plan:  44 y.o. MWF G2 P2 for annual exam with no complaints.  Endometriosis-amenorrheic Mirena IUD placed 04/2012-pain free Anxiety/depression stable on Lexapro  Plan: SBE's, continue annual screening 3-D mammogram, calcium rich diet, vitamin D 1000 daily encouraged. Reviewed importance of leisure activities, exercise, Lexapro 20 mg by mouth daily denies need for counseling at this time. CBC, lipid panel, TSH, CMP, UA, Pap with HR HPV typing, new screening guidelines reviewed.  Huel Cote WHNP, 10:02 AM 07/24/2015

## 2015-07-25 ENCOUNTER — Encounter: Payer: Self-pay | Admitting: Women's Health

## 2015-07-25 LAB — URINALYSIS W MICROSCOPIC + REFLEX CULTURE
BACTERIA UA: NONE SEEN [HPF]
BILIRUBIN URINE: NEGATIVE
CRYSTALS: NONE SEEN [HPF]
Casts: NONE SEEN [LPF]
GLUCOSE, UA: NEGATIVE
Hgb urine dipstick: NEGATIVE
Ketones, ur: NEGATIVE
NITRITE: NEGATIVE
PROTEIN: NEGATIVE
Specific Gravity, Urine: 1.017 (ref 1.001–1.035)
YEAST: NONE SEEN [HPF]
pH: 6.5 (ref 5.0–8.0)

## 2015-07-25 LAB — CYTOLOGY - PAP

## 2015-07-26 LAB — URINE CULTURE: Colony Count: 65000

## 2015-07-29 ENCOUNTER — Ambulatory Visit
Admission: RE | Admit: 2015-07-29 | Discharge: 2015-07-29 | Disposition: A | Discharge status: Home / Self Care | Payer: 59 | Source: Ambulatory Visit

## 2015-07-29 DIAGNOSIS — Z1231 Encounter for screening mammogram for malignant neoplasm of breast: Secondary | ICD-10-CM

## 2015-07-30 ENCOUNTER — Encounter: Payer: Self-pay | Admitting: Women's Health

## 2016-01-08 DIAGNOSIS — H33193 Other retinoschisis and retinal cysts, bilateral: Secondary | ICD-10-CM | POA: Diagnosis not present

## 2016-01-22 MED FILL — ESCITALOPRAM 20 MG TABLET: 20 | 90 days supply | Qty: 90 | Fill #1

## 2016-06-30 ENCOUNTER — Other Ambulatory Visit: Payer: Self-pay | Admitting: Women's Health

## 2016-06-30 DIAGNOSIS — Z1231 Encounter for screening mammogram for malignant neoplasm of breast: Secondary | ICD-10-CM

## 2016-07-08 DIAGNOSIS — H43811 Vitreous degeneration, right eye: Secondary | ICD-10-CM | POA: Diagnosis not present

## 2016-07-08 DIAGNOSIS — H33193 Other retinoschisis and retinal cysts, bilateral: Secondary | ICD-10-CM | POA: Diagnosis not present

## 2016-07-14 DIAGNOSIS — H52222 Regular astigmatism, left eye: Secondary | ICD-10-CM | POA: Diagnosis not present

## 2016-07-14 DIAGNOSIS — H5213 Myopia, bilateral: Secondary | ICD-10-CM | POA: Diagnosis not present

## 2016-07-29 ENCOUNTER — Ambulatory Visit: Payer: 59

## 2016-08-04 ENCOUNTER — Ambulatory Visit (INDEPENDENT_AMBULATORY_CARE_PROVIDER_SITE_OTHER): Payer: 59 | Admitting: Women's Health

## 2016-08-04 ENCOUNTER — Encounter: Payer: Self-pay | Admitting: Women's Health

## 2016-08-04 VITALS — BP 110/78 | Ht 64.0 in | Wt 137.0 lb

## 2016-08-04 DIAGNOSIS — B009 Herpesviral infection, unspecified: Secondary | ICD-10-CM | POA: Diagnosis not present

## 2016-08-04 DIAGNOSIS — F3289 Other specified depressive episodes: Secondary | ICD-10-CM

## 2016-08-04 DIAGNOSIS — Z01419 Encounter for gynecological examination (general) (routine) without abnormal findings: Secondary | ICD-10-CM | POA: Diagnosis not present

## 2016-08-04 DIAGNOSIS — Z13220 Encounter for screening for lipoid disorders: Secondary | ICD-10-CM

## 2016-08-04 LAB — CBC WITH DIFFERENTIAL/PLATELET
Basophils Absolute: 0 cells/uL (ref 0–200)
Basophils Relative: 0 %
EOS PCT: 4 %
Eosinophils Absolute: 184 cells/uL (ref 15–500)
HCT: 40.4 % (ref 35.0–45.0)
Hemoglobin: 13.5 g/dL (ref 11.7–15.5)
Lymphocytes Relative: 29 %
Lymphs Abs: 1334 cells/uL (ref 850–3900)
MCH: 30.5 pg (ref 27.0–33.0)
MCHC: 33.4 g/dL (ref 32.0–36.0)
MCV: 91.2 fL (ref 80.0–100.0)
MONOS PCT: 7 %
MPV: 10.1 fL (ref 7.5–12.5)
Monocytes Absolute: 322 cells/uL (ref 200–950)
NEUTROS ABS: 2760 {cells}/uL (ref 1500–7800)
Neutrophils Relative %: 60 %
Platelets: 315 10*3/uL (ref 140–400)
RBC: 4.43 MIL/uL (ref 3.80–5.10)
RDW: 12.7 % (ref 11.0–15.0)
WBC: 4.6 10*3/uL (ref 3.8–10.8)

## 2016-08-04 LAB — COMPREHENSIVE METABOLIC PANEL
ALK PHOS: 54 U/L (ref 33–115)
ALT: 11 U/L (ref 6–29)
AST: 19 U/L (ref 10–35)
Albumin: 4.7 g/dL (ref 3.6–5.1)
BUN: 11 mg/dL (ref 7–25)
CHLORIDE: 100 mmol/L (ref 98–110)
CO2: 27 mmol/L (ref 20–31)
CREATININE: 0.69 mg/dL (ref 0.50–1.10)
Calcium: 9.5 mg/dL (ref 8.6–10.2)
Glucose, Bld: 75 mg/dL (ref 65–99)
Potassium: 4.4 mmol/L (ref 3.5–5.3)
SODIUM: 137 mmol/L (ref 135–146)
Total Bilirubin: 1.1 mg/dL (ref 0.2–1.2)
Total Protein: 7.4 g/dL (ref 6.1–8.1)

## 2016-08-04 LAB — LIPID PANEL
CHOLESTEROL: 214 mg/dL — AB (ref <200)
HDL: 71 mg/dL (ref >50)
LDL Cholesterol: 133 mg/dL — ABNORMAL HIGH (ref <100)
Total CHOL/HDL Ratio: 3 Ratio (ref <5.0)
Triglycerides: 51 mg/dL (ref <150)
VLDL: 10 mg/dL (ref <30)

## 2016-08-04 MED ORDER — ESCITALOPRAM OXALATE 20 MG PO TABS: 20.0000 mg | ORAL_TABLET | Freq: Every day | ORAL | 4 refills | Status: DC

## 2016-08-04 MED ORDER — VALACYCLOVIR HCL 500 MG PO TABS: ORAL_TABLET | ORAL | 5 refills | Status: DC

## 2016-08-04 MED FILL — ESCITALOPRAM 20 MG TABLET: 20 | 90 days supply | Qty: 90 | Fill #0

## 2016-08-04 MED FILL — VALACYCLOVIR HCL 500 MG TAB: 500 | 30 days supply | Qty: 30 | Fill #0

## 2016-08-04 NOTE — Patient Instructions (Signed)

## 2016-08-04 NOTE — Progress Notes (Signed)
Dominique Powell [birth year] [id number]    History:    Presents for annual exam.  04/2012 Mirena IUD rare bleeding. History of HSV 1 rare outbreaks. History of endometriosis and infertility,was on Lupron with add back estrogen has been off now greater than 2 years pain free. Normal Pap and mammogram history.  Past medical history, past surgical history, family history and social history were all reviewed and documented in the EPIC chart. 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 Had been on home dialysis prior, polycystic kidney disease. Dominique Powell 15 had PDF surgery this past year, Dominique Powell 12 doing well.  ROS:  A ROS was performed and pertinent positives and negatives are included.  Exam:  Vitals:   08/04/16 0840  BP: 110/78  Weight: 137 lb (62.1 kg)  Height: 5\' 4"  (1.626 m)   Body mass index is 23.52 kg/m.   General appearance:  Normal Thyroid:  Symmetrical, normal in size, without palpable masses or nodularity. Respiratory  Auscultation:  Clear without wheezing or rhonchi Cardiovascular  Auscultation:  Regular rate, without rubs, murmurs or gallops  Edema/varicosities:  Not grossly evident Abdominal  Soft,nontender, without masses, guarding or rebound.  Liver/spleen:  No organomegaly noted  Hernia:  None appreciated  Skin  Inspection:  Grossly normal   Breasts: Examined lying and sitting.     Right: Without masses, retractions, discharge or axillary adenopathy.     Left: Without masses, retractions, discharge or axillary adenopathy. Gentitourinary   Inguinal/mons:  Normal without inguinal adenopathy  External genitalia:  Normal  BUS/Urethra/Skene's glands:  Normal  Vagina:  Normal  Cervix:  Normal IUD strings visible  Uterus:  Anteverted normal in size, shape and contour.  Midline and mobile  Adnexa/parametria:     Rt: Without masses or tenderness.   Lt: Without masses or tenderness.  Anus and perineum: Normal  Digital rectal exam: Normal  sphincter tone without palpated masses or tenderness  Assessment/Plan:  45 y.o. MWF G2 P2 for annual exam with no complaints.  04/2012 Mirena IUD rare bleeding HSV-1 History of endometriosis-pain-free Anxiety and depression stable on Lexapro  Plan: Return to office in August for IUD removal and replacement with Dr. Phineas Real, aware to schedule. SBE's, continue annual screening mammogram has scheduled. Continue healthy lifestyle of regular exercise, calcium rich diet, vitamin D 1000 daily encouraged. Lexapro 10 mg by mouth daily prescription, proper use given and reviewed. Declines need for counseling. Valtrex 500 twice daily for 3-5 days when necessary prescription, proper use given and reviewed. CBC, lipid panel, CMP, vitamin D, UA, Pap normal with negative HR HPV 2016, new screening guidelines reviewed.    [name], 9:07 AM 08/04/2016

## 2016-08-05 ENCOUNTER — Other Ambulatory Visit: Payer: Self-pay | Admitting: *Deleted

## 2016-08-05 DIAGNOSIS — R7989 Other specified abnormal findings of blood chemistry: Secondary | ICD-10-CM

## 2016-08-05 LAB — URINALYSIS W MICROSCOPIC + REFLEX CULTURE
BILIRUBIN URINE: NEGATIVE
Bacteria, UA: NONE SEEN [HPF]
CASTS: NONE SEEN [LPF]
CRYSTALS: NONE SEEN [HPF]
Glucose, UA: NEGATIVE
HGB URINE DIPSTICK: NEGATIVE
KETONES UR: NEGATIVE
Nitrite: NEGATIVE
PROTEIN: NEGATIVE
RBC / HPF: NONE SEEN RBC/HPF (ref <=2)
Specific Gravity, Urine: 1.021 (ref 1.001–1.035)
Yeast: NONE SEEN [HPF]
pH: 5.5 (ref 5.0–8.0)

## 2016-08-05 LAB — VITAMIN D 25 HYDROXY (VIT D DEFICIENCY, FRACTURES): VIT D 25 HYDROXY: 27 ng/mL — AB (ref 30–100)

## 2016-08-05 MED ORDER — VITAMIN D (ERGOCALCIFEROL) 1.25 MG (50000 UNIT) PO CAPS: 50000.0000 [IU] | ORAL_CAPSULE | ORAL | 0 refills | Status: DC

## 2016-08-05 MED FILL — VIT D2 1.25 MG (50,000 UNIT: 1.25 MG | 84 days supply | Qty: 12 | Fill #0

## 2016-08-06 LAB — URINE CULTURE

## 2016-09-02 ENCOUNTER — Ambulatory Visit: Payer: 59

## 2016-09-22 ENCOUNTER — Ambulatory Visit
Admission: RE | Admit: 2016-09-22 | Discharge: 2016-09-22 | Disposition: A | Discharge status: Home / Self Care | Payer: 59 | Source: Ambulatory Visit | Attending: Women's Health | Admitting: Women's Health

## 2016-09-22 DIAGNOSIS — Z1231 Encounter for screening mammogram for malignant neoplasm of breast: Secondary | ICD-10-CM | POA: Diagnosis not present

## 2016-09-23 ENCOUNTER — Encounter: Payer: Self-pay | Admitting: Women's Health

## 2016-11-13 ENCOUNTER — Other Ambulatory Visit: Payer: 59

## 2016-11-13 DIAGNOSIS — R7989 Other specified abnormal findings of blood chemistry: Secondary | ICD-10-CM

## 2016-11-13 DIAGNOSIS — E559 Vitamin D deficiency, unspecified: Secondary | ICD-10-CM | POA: Diagnosis not present

## 2016-11-14 LAB — VITAMIN D 25 HYDROXY (VIT D DEFICIENCY, FRACTURES): Vit D, 25-Hydroxy: 34 ng/mL (ref 30–100)

## 2016-11-17 ENCOUNTER — Encounter: Payer: Self-pay | Admitting: *Deleted

## 2016-12-01 DIAGNOSIS — H33193 Other retinoschisis and retinal cysts, bilateral: Secondary | ICD-10-CM | POA: Diagnosis not present

## 2016-12-01 DIAGNOSIS — H43811 Vitreous degeneration, right eye: Secondary | ICD-10-CM | POA: Diagnosis not present

## 2017-01-05 DIAGNOSIS — H33193 Other retinoschisis and retinal cysts, bilateral: Secondary | ICD-10-CM | POA: Diagnosis not present

## 2017-01-05 DIAGNOSIS — H43811 Vitreous degeneration, right eye: Secondary | ICD-10-CM | POA: Diagnosis not present

## 2017-01-12 ENCOUNTER — Ambulatory Visit (INDEPENDENT_AMBULATORY_CARE_PROVIDER_SITE_OTHER): Payer: 59

## 2017-01-12 ENCOUNTER — Encounter: Payer: Self-pay | Admitting: Women's Health

## 2017-01-12 ENCOUNTER — Ambulatory Visit (INDEPENDENT_AMBULATORY_CARE_PROVIDER_SITE_OTHER): Payer: 59 | Admitting: Women's Health

## 2017-01-12 VITALS — BP 128/80

## 2017-01-12 DIAGNOSIS — N83202 Unspecified ovarian cyst, left side: Secondary | ICD-10-CM | POA: Diagnosis not present

## 2017-01-12 DIAGNOSIS — N809 Endometriosis, unspecified: Secondary | ICD-10-CM

## 2017-01-12 DIAGNOSIS — R1032 Left lower quadrant pain: Secondary | ICD-10-CM

## 2017-01-12 NOTE — Patient Instructions (Signed)
Ovarian Cyst  An ovarian cyst is a fluid-filled sac that forms on an ovary. The ovaries are small organs that produce eggs in women. Various types of cysts can form on the ovaries. Some may cause symptoms and require treatment. Most ovarian cysts go away on their own, are not cancerous (are benign), and do not cause problems. Common types of ovarian cysts include:  Functional (follicle) cysts.  Occur during the menstrual cycle, and usually go away with the next menstrual cycle if you do not get pregnant.  Usually cause no symptoms.  Endometriomas.  Are cysts that form from the tissue that lines the uterus (endometrium).  Are sometimes called "chocolate cysts" because they become filled with blood that turns brown.  Can cause pain in the lower abdomen during intercourse and during your period.  Cystadenoma cysts.  Develop from cells on the outside surface of the ovary.  Can get very large and cause lower abdomen pain and pain with intercourse.  Can cause severe pain if they twist or break open (rupture).  Dermoid cysts.  Are sometimes found in both ovaries.  May contain different kinds of body tissue, such as skin, teeth, hair, or cartilage.  Usually do not cause symptoms unless they get very big.  Theca lutein cysts.  Occur when too much of a certain hormone (human chorionic gonadotropin) is produced and overstimulates the ovaries to produce an egg.  Are most common after having procedures used to assist with the conception of a baby (in vitro fertilization). What are the causes? Ovarian cysts may be caused by:  Ovarian hyperstimulation syndrome. This is a condition that can develop from taking fertility medicines. It causes multiple large ovarian cysts to form.  Polycystic ovarian syndrome (PCOS). This is a common hormonal disorder that can cause ovarian cysts, as well as problems with your period or fertility. What increases the risk? The following factors may make you  more likely to develop ovarian cysts:  Being overweight or obese.  Taking fertility medicines.  Taking certain forms of hormonal birth control.  Smoking. What are the signs or symptoms? Many ovarian cysts do not cause symptoms. If symptoms are present, they may include:  Pelvic pain or pressure.  Pain in the lower abdomen.  Pain during sex.  Abdominal swelling.  Abnormal menstrual periods.  Increasing pain with menstrual periods. How is this diagnosed? These cysts are commonly found during a routine pelvic exam. You may have tests to find out more about the cyst, such as:  Ultrasound.  X-ray of the pelvis.  CT scan.  MRI.  Blood tests. How is this treated? Many ovarian cysts go away on their own without treatment. Your health care provider may want to check your cyst regularly for 2-3 months to see if it changes. If you are in menopause, it is especially important to have your cyst monitored closely because menopausal women have a higher rate of ovarian cancer. When treatment is needed, it may include:  Medicines to help relieve pain.  A procedure to drain the cyst (aspiration).  Surgery to remove the whole cyst.  Hormone treatment or birth control pills. These methods are sometimes used to help dissolve a cyst. Follow these instructions at home:  Take over-the-counter and prescription medicines only as told by your health care provider.  Do not drive or use heavy machinery while taking prescription pain medicine.  Get regular pelvic exams and Pap tests as often as told by your health care provider.  Return to your   normal activities as told by your health care provider. Ask your health care provider what activities are safe for you.  Do not use any products that contain nicotine or tobacco, such as cigarettes and e-cigarettes. If you need help quitting, ask your health care provider.  Keep all follow-up visits as told by your health care provider. This is  important. Contact a health care provider if:  Your periods are late, irregular, or painful, or they stop.  You have pelvic pain that does not go away.  You have pressure on your bladder or trouble emptying your bladder completely.  You have pain during sex.  You have any of the following in your abdomen:  A feeling of fullness.  Pressure.  Discomfort.  Pain that does not go away.  Swelling.  You feel generally ill.  You become constipated.  You lose your appetite.  You develop severe acne.  You start to have more body hair and facial hair.  You are gaining weight or losing weight without changing your exercise and eating habits.  You think you may be pregnant. Get help right away if:  You have abdominal pain that is severe or gets worse.  You cannot eat or drink without vomiting.  You suddenly develop a fever.  Your menstrual period is much heavier than usual. This information is not intended to replace advice given to you by your health care provider. Make sure you discuss any questions you have with your health care provider. Document Released: 08/24/2005 Document Revised: 03/13/2016 Document Reviewed: 01/26/2016 Elsevier Interactive Patient Education  2017 [employer]  

## 2017-01-12 NOTE — Progress Notes (Signed)
Presents with complaints of pain in left lower quadrants of abdomen for 2 weeks that started with intercourse.  Achy, annoying type pain, rates at about 5-6, 0 pain today. Pain subsided on its own. Denies constipation, vaginal discharge, bleeding, urinary symptoms, or fever.Verdis Frederickson IUD rare bleeding, questions if there is a problem with the IUD. History of endometriosis.  Exam: Appears well, in no distress. No pain at this time.  Ultrasound:Retroverted uterus, IUD in normal position, right ovary normal. Free fluid in CDS = 23x53mm, lov thin walled cyst 32x27x51mm =27 mm mean with solid vascular focus wall of cyst =6x6 mm ? involuting follicle + arterial blood flow to area. 2nd cystic mass reticular ECHO pattern = 18x17x17 mm negativ CFD.  Left ovarian cyst Mirena IUD  Plan: Repeat ultrasound in 3 months, reviewed pain most likely coming form resolving left ovarian cysts. Reviewed IUD appears normal.

## 2017-03-01 MED FILL — ESCITALOPRAM 20 MG TABLET: 20 | 90 days supply | Qty: 90 | Fill #1

## 2017-04-23 ENCOUNTER — Encounter: Payer: Self-pay | Admitting: Gynecology

## 2017-04-23 ENCOUNTER — Ambulatory Visit (INDEPENDENT_AMBULATORY_CARE_PROVIDER_SITE_OTHER): Payer: 59 | Admitting: Gynecology

## 2017-04-23 VITALS — BP 110/70

## 2017-04-23 DIAGNOSIS — Z30433 Encounter for removal and reinsertion of intrauterine contraceptive device: Secondary | ICD-10-CM | POA: Diagnosis not present

## 2017-04-23 HISTORY — PX: INTRAUTERINE DEVICE INSERTION: SHX323

## 2017-04-23 NOTE — Patient Instructions (Signed)

## 2017-04-23 NOTE — Progress Notes (Signed)
    Dominique Powell [birth year] [id number]        46 y.o.  [id number]  presents with her husband for Mirena IUD replacement with last IUD placed 5 years ago this month. She has read through the booklet, has no contraindications and signed the consent form. She is not having menses.  I reviewed the insertional process with her as well as the risks to include infection, either immediate or long-term, uterine perforation or migration requiring surgery to remove, other complications such as pain and possibility of failure with subsequent pregnancy.   Exam with Gilman Schmidt assistant Vitals:   04/23/17 1014  BP: 110/70    Pelvic: External BUS vagina normal. Cervix normal with IUD string not visible. Uterus retroverted normal size shape contour midline mobile nontender. Adnexa without masses or tenderness.  Procedure: The cervix was visualized and using a [place] forcep which was opened enclosed within the cervical canal the IUD string was grasped, the IUD removed, shown to the patient and discarded. The cervix was cleansed with Betadine, anterior lip grasped with a single-tooth tenaculum, the uterus was sounded and a Mirena IUD was placed according to manufacturer's recommendations without difficulty. The strings were trimmed. The patient tolerated well and will follow up in one month for a postinsertional check.  Lot number:  [id number]    Anastasio Auerbach MD, 10:40 AM 04/23/2017

## 2017-05-03 ENCOUNTER — Telehealth: Payer: 59 | Admitting: Family

## 2017-05-03 DIAGNOSIS — J301 Allergic rhinitis due to pollen: Secondary | ICD-10-CM | POA: Diagnosis not present

## 2017-05-03 MED ORDER — FLUTICASONE PROPIONATE 50 MCG/ACT NA SUSP: 2.0000 | Freq: Every day | NASAL | 6 refills | Status: DC

## 2017-05-03 MED FILL — FLUTICASONE PROP 50 MCG SPR: 50 | 30 days supply | Qty: 16 | Fill #0

## 2017-05-03 NOTE — Progress Notes (Signed)
E visit for Allergic Rhinitis We are sorry that you are not feeling well.  Her is how we plan to help!  Based on what you have shared with me it looks like you have Allergic Rhinitis.  Rhinitis is when a reaction occurs that causes nasal congestion, runny nose, sneezing, and itching.  Most types of rhinitis are caused by an inflammation and are associated with symptoms in the eyes ears or throat. There are several types of rhinitis.  The most common are acute rhinitis, which is usually caused by a viral illness, allergic or seasonal rhinitis, and nonallergic or year-round rhinitis.  Nasal allergies occur certain times of the year.  Allergic rhinitis is caused when allergens in the air trigger the release of histamine in the body.  Histamine causes itching, swelling, and fluid to build up in the fragile linings of the nasal passages, sinuses and eyelids.  An itchy nose and clear discharge are common.   I also would recommend a nasal spray: Flonase 2 sprays into each nostril once daily   HOME CARE:   You can use an over-the-counter saline nasal spray as needed  Avoid areas where there is heavy dust, mites, or molds  Stay indoors on windy days during the pollen season  Keep windows closed in home, at least in bedroom; use air conditioner.  Use high-efficiency house air filter  Keep windows closed in car, turn AC on re-circulate  Avoid playing out with dog during pollen season  GET HELP RIGHT AWAY IF:   If your symptoms do not improve within 10 days  You become short of breath  You develop yellow or green discharge from your nose for over 3 days  You have coughing fits  MAKE SURE YOU:   Understand these instructions  Will watch your condition  Will get help right away if you are not doing well or get worse  Thank you for choosing an e-visit. Your e-visit answers were reviewed by a board certified advanced clinical practitioner to complete your personal care plan.  Depending upon the condition, your plan could have included both over the counter or prescription medications. Please review your pharmacy choice. Be sure that the pharmacy you have chosen is open so that you can pick up your prescription now.  If there is a problem you may message your provider in [place] to have the prescription routed to another pharmacy. Your safety is important to [place]. If you have drug allergies check your prescription carefully.  For the next 24 hours, you can use MyChart to ask questions about today's visit, request a non-urgent call back, or ask for a work or school excuse from your e-visit provider. You will get an email in the next two days asking about your experience. I hope that your e-visit has been valuable and will speed your recovery.

## 2017-05-27 ENCOUNTER — Encounter: Payer: Self-pay | Admitting: Gynecology

## 2017-05-27 ENCOUNTER — Ambulatory Visit (INDEPENDENT_AMBULATORY_CARE_PROVIDER_SITE_OTHER): Payer: 59 | Admitting: Gynecology

## 2017-05-27 VITALS — BP 110/66

## 2017-05-27 DIAGNOSIS — Z30431 Encounter for routine checking of intrauterine contraceptive device: Secondary | ICD-10-CM

## 2017-05-27 NOTE — Progress Notes (Signed)
    Dominique Powell 10/28/1970 [id number]        46 y.o.  [id number] presents for IUD follow up exam. Had Mirena IUD switched 04/23/2017. Doing well without complaints.  Past medical history,surgical history, problem list, medications, allergies, family history and social history were all reviewed and documented in the EPIC chart.  Directed ROS with pertinent positives and negatives documented in the history of present illness/assessment and plan.  Exam: Caryn Bee assistant Vitals:   05/27/17 0813  BP: 110/66   General appearance:  Normal Abdomen soft nontender without masses guarding rebound Pelvic external BUS vagina normal. Cervix normal. IUD string visualized and appropriate length. Uterus retroverted normal size midline mobile nontender. Adnexa without masses or tenderness.  Assessment/Plan:  46 y.o. [id number] with normal follow up IUD check. Patient will follow up for her annual exam later this year. Sooner if any issues.    Anastasio Auerbach MD, 8:24 AM 05/27/2017

## 2017-05-27 NOTE — Patient Instructions (Signed)
Follow up for annual exam end of this year.

## 2017-05-28 DIAGNOSIS — D2261 Melanocytic nevi of right upper limb, including shoulder: Secondary | ICD-10-CM | POA: Diagnosis not present

## 2017-05-28 DIAGNOSIS — L814 Other melanin hyperpigmentation: Secondary | ICD-10-CM | POA: Diagnosis not present

## 2017-05-28 DIAGNOSIS — D225 Melanocytic nevi of trunk: Secondary | ICD-10-CM | POA: Diagnosis not present

## 2017-05-28 DIAGNOSIS — D1801 Hemangioma of skin and subcutaneous tissue: Secondary | ICD-10-CM | POA: Diagnosis not present

## 2017-05-28 DIAGNOSIS — L720 Epidermal cyst: Secondary | ICD-10-CM | POA: Diagnosis not present

## 2017-05-28 DIAGNOSIS — L821 Other seborrheic keratosis: Secondary | ICD-10-CM | POA: Diagnosis not present

## 2017-05-28 DIAGNOSIS — D2262 Melanocytic nevi of left upper limb, including shoulder: Secondary | ICD-10-CM | POA: Diagnosis not present

## 2017-06-08 DIAGNOSIS — H33193 Other retinoschisis and retinal cysts, bilateral: Secondary | ICD-10-CM | POA: Diagnosis not present

## 2017-06-08 DIAGNOSIS — H2513 Age-related nuclear cataract, bilateral: Secondary | ICD-10-CM | POA: Diagnosis not present

## 2017-06-08 DIAGNOSIS — H43811 Vitreous degeneration, right eye: Secondary | ICD-10-CM | POA: Diagnosis not present

## 2017-06-09 MED FILL — VALACYCLOVIR HCL 500 MG TAB: 500 | 30 days supply | Qty: 30 | Fill #1

## 2017-07-26 DIAGNOSIS — H52223 Regular astigmatism, bilateral: Secondary | ICD-10-CM | POA: Diagnosis not present

## 2017-07-26 DIAGNOSIS — H5213 Myopia, bilateral: Secondary | ICD-10-CM | POA: Diagnosis not present

## 2017-08-11 ENCOUNTER — Encounter: Payer: Self-pay | Admitting: Women's Health

## 2017-08-11 ENCOUNTER — Ambulatory Visit (INDEPENDENT_AMBULATORY_CARE_PROVIDER_SITE_OTHER): Payer: 59 | Admitting: Women's Health

## 2017-08-11 VITALS — BP 120/76 | Ht 64.0 in | Wt 147.0 lb

## 2017-08-11 DIAGNOSIS — Z01419 Encounter for gynecological examination (general) (routine) without abnormal findings: Secondary | ICD-10-CM | POA: Diagnosis not present

## 2017-08-11 DIAGNOSIS — Z975 Presence of (intrauterine) contraceptive device: Secondary | ICD-10-CM

## 2017-08-11 DIAGNOSIS — B009 Herpesviral infection, unspecified: Secondary | ICD-10-CM

## 2017-08-11 DIAGNOSIS — F3289 Other specified depressive episodes: Secondary | ICD-10-CM | POA: Diagnosis not present

## 2017-08-11 DIAGNOSIS — Z13220 Encounter for screening for lipoid disorders: Secondary | ICD-10-CM

## 2017-08-11 DIAGNOSIS — L659 Nonscarring hair loss, unspecified: Secondary | ICD-10-CM | POA: Diagnosis not present

## 2017-08-11 LAB — COMPREHENSIVE METABOLIC PANEL
AG Ratio: 2 (calc) (ref 1.0–2.5)
ALKALINE PHOSPHATASE (APISO): 58 U/L (ref 33–115)
ALT: 13 U/L (ref 6–29)
AST: 17 U/L (ref 10–35)
Albumin: 4.6 g/dL (ref 3.6–5.1)
BILIRUBIN TOTAL: 1.8 mg/dL — AB (ref 0.2–1.2)
BUN: 16 mg/dL (ref 7–25)
CALCIUM: 9.2 mg/dL (ref 8.6–10.2)
CO2: 28 mmol/L (ref 20–32)
Chloride: 99 mmol/L (ref 98–110)
Creat: 0.68 mg/dL (ref 0.50–1.10)
Globulin: 2.3 g/dL (calc) (ref 1.9–3.7)
Glucose, Bld: 72 mg/dL (ref 65–99)
POTASSIUM: 3.8 mmol/L (ref 3.5–5.3)
Sodium: 135 mmol/L (ref 135–146)
Total Protein: 6.9 g/dL (ref 6.1–8.1)

## 2017-08-11 LAB — CBC WITH DIFFERENTIAL/PLATELET
Basophils Absolute: 39 cells/uL (ref 0–200)
Basophils Relative: 0.8 %
Eosinophils Absolute: 191 cells/uL (ref 15–500)
Eosinophils Relative: 3.9 %
HEMATOCRIT: 38.8 % (ref 35.0–45.0)
Hemoglobin: 13 g/dL (ref 11.7–15.5)
LYMPHS ABS: 1504 {cells}/uL (ref 850–3900)
MCH: 30.7 pg (ref 27.0–33.0)
MCHC: 33.5 g/dL (ref 32.0–36.0)
MCV: 91.5 fL (ref 80.0–100.0)
MPV: 11.2 fL (ref 7.5–12.5)
Monocytes Relative: 8.6 %
NEUTROS PCT: 56 %
Neutro Abs: 2744 cells/uL (ref 1500–7800)
Platelets: 270 10*3/uL (ref 140–400)
RBC: 4.24 10*6/uL (ref 3.80–5.10)
RDW: 12.2 % (ref 11.0–15.0)
Total Lymphocyte: 30.7 %
WBC: 4.9 10*3/uL (ref 3.8–10.8)
WBCMIX: 421 {cells}/uL (ref 200–950)

## 2017-08-11 LAB — LIPID PANEL
CHOLESTEROL: 211 mg/dL — AB (ref <200)
HDL: 88 mg/dL (ref >50)
LDL CHOLESTEROL (CALC): 111 mg/dL — AB
Non-HDL Cholesterol (Calc): 123 mg/dL (calc) (ref <130)
TRIGLYCERIDES: 41 mg/dL (ref <150)
Total CHOL/HDL Ratio: 2.4 (calc) (ref <5.0)

## 2017-08-11 LAB — TSH: TSH: 1.89 mIU/L

## 2017-08-11 MED ORDER — VALACYCLOVIR HCL 500 MG PO TABS: ORAL_TABLET | ORAL | 5 refills | Status: DC

## 2017-08-11 MED ORDER — ESCITALOPRAM OXALATE 20 MG PO TABS: 20.0000 mg | ORAL_TABLET | Freq: Every day | ORAL | 4 refills | Status: DC

## 2017-08-11 MED FILL — ESCITALOPRAM 20 MG TABLET: 20 | 90 days supply | Qty: 90 | Fill #0

## 2017-08-11 MED FILL — VALACYCLOVIR HCL 500 MG TAB: 500 | 15 days supply | Qty: 30 | Fill #0

## 2017-08-11 NOTE — Patient Instructions (Addendum)
Carbohydrate Counting for Diabetes Mellitus, Adult Carbohydrate counting is a method for keeping track of how many carbohydrates you eat. Eating carbohydrates naturally increases the amount of sugar (glucose) in the blood. Counting how many carbohydrates you eat helps keep your blood glucose within normal limits, which helps you manage your diabetes (diabetes mellitus). It is important to know how many carbohydrates you can safely have in each meal. This is different for every person. A diet and nutrition specialist (regiHealth Maintenance, Female Adopting a healthy lifestyle and getting preventive care can go a long way to promote health and wellness. Talk with your health care provider about what schedule of regular examinations is right for you. This is a good chance for you to check in with your provider about disease prevention and staying healthy. In between checkups, there are plenty of things you can do on your own. Experts have done a lot of research about which lifestyle changes and preventive measures are most likely to keep you healthy. Ask your health care provider for more information. Weight and diet Eat a healthy diet  Be sure to include plenty of vegetables, fruits, low-fat dairy products, and lean protein.  Do not eat a lot of foods high in solid fats, added sugars, or salt.  Get regular exercise. This is one of the most important things you can do for your health. ? Most adults should exercise for at least 150 minutes each week. The exercise should increase your heart rate and make you sweat (moderate-intensity exercise). ? Most adults should also do strengthening exercises at least twice a week. This is in addition to the moderate-intensity exercise.  Maintain a healthy weight  Body mass index (BMI) is a measurement that can be used to identify possible weight problems. It estimates body fat based on height and weight. Your health care provider can help determine your BMI and  help you achieve or maintain a healthy weight.  For females 64 years of age and older: ? A BMI below 18.5 is considered underweight. ? A BMI of 18.5 to 24.9 is normal. ? A BMI of 25 to 29.9 is considered overweight. ? A BMI of 30 and above is considered obese.  Watch levels of cholesterol and blood lipids  You should start having your blood tested for lipids and cholesterol at 47 years of age, then have this test every 5 years.  You may need to have your cholesterol levels checked more often if: ? Your lipid or cholesterol levels are high. ? You are older than 47 years of age. ? You are at high risk for heart disease.  Cancer screening Lung Cancer  Lung cancer screening is recommended for adults 42-70 years old who are at high risk for lung cancer because of a history of smoking.  A yearly low-dose CT scan of the lungs is recommended for people who: ? Currently smoke. ? Have quit within the past 15 years. ? Have at least a 30-pack-year history of smoking. A pack year is smoking an average of one pack of cigarettes a day for 1 year.  Yearly screening should continue until it has been 15 years since you quit.  Yearly screening should stop if you develop a health problem that would prevent you from having lung cancer treatment.  Breast Cancer  Practice breast self-awareness. This means understanding how your breasts normally appear and feel.  It also means doing regular breast self-exams. Let your health care provider know about any changes, no matter how  small.  If you are in your 20s or 30s, you should have a clinical breast exam (CBE) by a health care provider every 1-3 years as part of a regular health exam.  If you are 40 or older, have a CBE every year. Also consider having a breast X-ray (mammogram) every year.  If you have a family history of breast cancer, talk to your health care provider about genetic screening.  If you are at high risk for breast cancer, talk to  your health care provider about having an MRI and a mammogram every year.  Breast cancer gene (BRCA) assessment is recommended for women who have family members with BRCA-related cancers. BRCA-related cancers include: ? Breast. ? Ovarian. ? Tubal. ? Peritoneal cancers.  Results of the assessment will determine the need for genetic counseling and BRCA1 and BRCA2 testing.  Cervical Cancer Your health care provider may recommend that you be screened regularly for cancer of the pelvic organs (ovaries, uterus, and vagina). This screening involves a pelvic examination, including checking for microscopic changes to the surface of your cervix (Pap test). You may be encouraged to have this screening done every 3 years, beginning at age 58.  For women ages 60-65, health care providers may recommend pelvic exams and Pap testing every 3 years, or they may recommend the Pap and pelvic exam, combined with testing for human papilloma virus (HPV), every 5 years. Some types of HPV increase your risk of cervical cancer. Testing for HPV may also be done on women of any age with unclear Pap test results.  Other health care providers may not recommend any screening for nonpregnant women who are considered low risk for pelvic cancer and who do not have symptoms. Ask your health care provider if a screening pelvic exam is right for you.  If you have had past treatment for cervical cancer or a condition that could lead to cancer, you need Pap tests and screening for cancer for at least 20 years after your treatment. If Pap tests have been discontinued, your risk factors (such as having a new sexual partner) need to be reassessed to determine if screening should resume. Some women have medical problems that increase the chance of getting cervical cancer. In these cases, your health care provider may recommend more frequent screening and Pap tests.  Colorectal Cancer  This type of cancer can be detected and often  prevented.  Routine colorectal cancer screening usually begins at 47 years of age and continues through 47 years of age.  Your health care provider may recommend screening at an earlier age if you have risk factors for colon cancer.  Your health care provider may also recommend using home test kits to check for hidden blood in the stool.  A small camera at the end of a tube can be used to examine your colon directly (sigmoidoscopy or colonoscopy). This is done to check for the earliest forms of colorectal cancer.  Routine screening usually begins at age 43.  Direct examination of the colon should be repeated every 5-10 years through 47 years of age. However, you may need to be screened more often if early forms of precancerous polyps or small growths are found.  Skin Cancer  Check your skin from head to toe regularly.  Tell your health care provider about any new moles or changes in moles, especially if there is a change in a mole's shape or color.  Also tell your health care provider if you have a mole  that is larger than the size of a pencil eraser.  Always use sunscreen. Apply sunscreen liberally and repeatedly throughout the day.  Protect yourself by wearing long sleeves, pants, a wide-brimmed hat, and sunglasses whenever you are outside.  Heart disease, diabetes, and high blood pressure  High blood pressure causes heart disease and increases the risk of stroke. High blood pressure is more likely to develop in: ? People who have blood pressure in the high end of the normal range (130-139/85-89 mm Hg). ? People who are overweight or obese. ? People who are African American.  If you are 19-30 years of age, have your blood pressure checked every 3-5 years. If you are 76 years of age or older, have your blood pressure checked every year. You should have your blood pressure measured twice-once when you are at a hospital or clinic, and once when you are not at a hospital or clinic.  Record the average of the two measurements. To check your blood pressure when you are not at a hospital or clinic, you can use: ? An automated blood pressure machine at a pharmacy. ? A home blood pressure monitor.  If you are between 24 years and 55 years old, ask your health care provider if you should take aspirin to prevent strokes.  Have regular diabetes screenings. This involves taking a blood sample to check your fasting blood sugar level. ? If you are at a normal weight and have a low risk for diabetes, have this test once every three years after 47 years of age. ? If you are overweight and have a high risk for diabetes, consider being tested at a younger age or more often. Preventing infection Hepatitis B  If you have a higher risk for hepatitis B, you should be screened for this virus. You are considered at high risk for hepatitis B if: ? You were born in a country where hepatitis B is common. Ask your health care provider which countries are considered high risk. ? Your parents were born in a high-risk country, and you have not been immunized against hepatitis B (hepatitis B vaccine). ? You have HIV or AIDS. ? You use needles to inject street drugs. ? You live with someone who has hepatitis B. ? You have had sex with someone who has hepatitis B. ? You get hemodialysis treatment. ? You take certain medicines for conditions, including cancer, organ transplantation, and autoimmune conditions.  Hepatitis C  Blood testing is recommended for: ? Everyone born from [birth year] through 1966. ? Anyone with known risk factors for hepatitis C.  Sexually transmitted infections (STIs)  You should be screened for sexually transmitted infections (STIs) including gonorrhea and chlamydia if: ? You are sexually active and are younger than 47 years of age. ? You are older than 47 years of age and your health care provider tells you that you are at risk for this type of infection. ? Your sexual  activity has changed since you were last screened and you are at an increased risk for chlamydia or gonorrhea. Ask your health care provider if you are at risk.  If you do not have HIV, but are at risk, it may be recommended that you take a prescription medicine daily to prevent HIV infection. This is called pre-exposure prophylaxis (PrEP). You are considered at risk if: ? You are sexually active and do not regularly use condoms or know the HIV status of your partner(s). ? You take drugs by injection. ? You are  sexually active with a partner who has HIV.  Talk with your health care provider about whether you are at high risk of being infected with HIV. If you choose to begin PrEP, you should first be tested for HIV. You should then be tested every 3 months for as long as you are taking PrEP. Pregnancy  If you are premenopausal and you may become pregnant, ask your health care provider about preconception counseling.  If you may become pregnant, take 400 to 800 micrograms (mcg) of folic acid every day.  If you want to prevent pregnancy, talk to your health care provider about birth control (contraception). Osteoporosis and menopause  Osteoporosis is a disease in which the bones lose minerals and strength with aging. This can result in serious bone fractures. Your risk for osteoporosis can be identified using a bone density scan.  If you are 40 years of age or older, or if you are at risk for osteoporosis and fractures, ask your health care provider if you should be screened.  Ask your health care provider whether you should take a calcium or vitamin D supplement to lower your risk for osteoporosis.  Menopause may have certain physical symptoms and risks.  Hormone replacement therapy may reduce some of these symptoms and risks. Talk to your health care provider about whether hormone replacement therapy is right for you. Follow these instructions at home:  Schedule regular health, dental,  and eye exams.  Stay current with your immunizations.  Do not use any tobacco products including cigarettes, chewing tobacco, or electronic cigarettes.  If you are pregnant, do not drink alcohol.  If you are breastfeeding, limit how much and how often you drink alcohol.  Limit alcohol intake to no more than 1 drink per day for nonpregnant women. One drink equals 12 ounces of beer, 5 ounces of wine, or 1 ounces of hard liquor.  Do not use street drugs.  Do not share needles.  Ask your health care provider for help if you need support or information about quitting drugs.  Tell your health care provider if you often feel depressed.  Tell your health care provider if you have ever been abused or do not feel safe at home. This information is not intended to replace advice given to you by your health care provider. Make sure you discuss any questions you have with your health care provider. Document Released: 03/09/2011 Document Revised: 01/30/2016 Document Reviewed: 05/28/2015 Elsevier Interactive Patient Education  2018 [employer]. stered dietitian) can help you make a meal plan and calculate how many carbohydrates you should have at each meal and snack. Carbohydrates are found in the following foods:  Grains, such as breads and cereals.  Dried beans and soy products.  Starchy vegetables, such as potatoes, peas, and corn.  Fruit and fruit juices.  Milk and yogurt.  Sweets and snack foods, such as cake, cookies, candy, chips, and soft drinks.  How do I count carbohydrates? There are two ways to count carbohydrates in food. You can use either of the methods or a combination of both. Reading "Nutrition Facts" on packaged food The "Nutrition Facts" list is included on the labels of almost all packaged foods and beverages in the [place] It includes:  The serving size.  Information about nutrients in each serving, including the grams (g) of carbohydrate per serving.  To use the  "Nutrition Facts":  Decide how many servings you will have.  Multiply the number of servings by the number of carbohydrates  per serving.  The resulting number is the total amount of carbohydrates that you will be having.  Learning standard serving sizes of other foods When you eat foods containing carbohydrates that are not packaged or do not include "Nutrition Facts" on the label, you need to measure the servings in order to count the amount of carbohydrates:  Measure the foods that you will eat with a food scale or measuring cup, if needed.  Decide how many standard-size servings you will eat.  Multiply the number of servings by 15. Most carbohydrate-rich foods have about 15 g of carbohydrates per serving. ? For example, if you eat 8 oz (170 g) of strawberries, you will have eaten 2 servings and 30 g of carbohydrates (2 servings x 15 g = 30 g).  For foods that have more than one food mixed, such as soups and casseroles, you must count the carbohydrates in each food that is included.  The following list contains standard serving sizes of common carbohydrate-rich foods. Each of these servings has about 15 g of carbohydrates:   hamburger bun or  English muffin.   oz (15 mL) syrup.   oz (14 g) jelly.  1 slice of bread.  1 six-inch tortilla.  3 oz (85 g) cooked rice or pasta.  4 oz (113 g) cooked dried beans.  4 oz (113 g) starchy vegetable, such as peas, corn, or potatoes.  4 oz (113 g) hot cereal.  4 oz (113 g) mashed potatoes or  of a large baked potato.  4 oz (113 g) canned or frozen fruit.  4 oz (120 mL) fruit juice.  4-6 crackers.  6 chicken nuggets.  6 oz (170 g) unsweetened dry cereal.  6 oz (170 g) plain fat-free yogurt or yogurt sweetened with artificial sweeteners.  8 oz (240 mL) milk.  8 oz (170 g) fresh fruit or one small piece of fruit.  24 oz (680 g) popped popcorn.  Example of carbohydrate counting Sample meal  3 oz (85 g) chicken  breast.  6 oz (170 g) brown rice.  4 oz (113 g) corn.  8 oz (240 mL) milk.  8 oz (170 g) strawberries with sugar-free whipped topping. Carbohydrate calculation 1. Identify the foods that contain carbohydrates: ? Rice. ? Corn. ? Milk. ? Strawberries. 2. Calculate how many servings you have of each food: ? 2 servings rice. ? 1 serving corn. ? 1 serving milk. ? 1 serving strawberries. 3. Multiply each number of servings by 15 g: ? 2 servings rice x 15 g = 30 g. ? 1 serving corn x 15 g = 15 g. ? 1 serving milk x 15 g = 15 g. ? 1 serving strawberries x 15 g = 15 g. 4. Add together all of the amounts to find the total grams of carbohydrates eaten: ? 30 g + 15 g + 15 g + 15 g = 75 g of carbohydrates total. This information is not intended to replace advice given to you by your health care provider. Make sure you discuss any questions you have with your health care provider. Document Released: 08/24/2005 Document Revised: 03/13/2016 Document Reviewed: 02/05/2016 Elsevier Interactive Patient Education  [employer].

## 2017-08-11 NOTE — Progress Notes (Signed)
Dominique Powell 1971-06-15 [id number]    History:    Presents for annual exam.  04/2017 Mirena IUD, amenorrhea. History of endometriosis asymptomatic. HSV 1 rare outbreaks. Normal Pap and mammogram history. History of infertility, has 2 children.  Past medical history, past surgical history, family history and social history were all reviewed and documented in the EPIC chart. Dominique Powell 46, Dominique Powell 14 both doing well. Husband had a kidney transplant 2016 and is doing well.  ROS:  A ROS was performed and pertinent positives and negatives are included.  Exam:  Vitals:   08/11/17 0924  BP: 120/76  Weight: 147 lb (66.7 kg)  Height: 5\' 4"  (1.626 m)   Body mass index is 25.23 kg/m.   General appearance:  Normal Thyroid:  Symmetrical, normal in size, without palpable masses or nodularity. Respiratory  Auscultation:  Clear without wheezing or rhonchi Cardiovascular  Auscultation:  Regular rate, without rubs, murmurs or gallops  Edema/varicosities:  Not grossly evident Abdominal  Soft,nontender, without masses, guarding or rebound.  Liver/spleen:  No organomegaly noted  Hernia:  None appreciated  Skin  Inspection:  Grossly normal   Breasts: Examined lying and sitting.     Right: Without masses, retractions, discharge or axillary adenopathy.     Left: Without masses, retractions, discharge or axillary adenopathy. Gentitourinary   Inguinal/mons:  Normal without inguinal adenopathy  External genitalia:  Normal  BUS/Urethra/Skene's glands:  Normal  Vagina:  Normal  Cervix:  Normal IUD strings visible  Uterus:   normal in size, shape and contour.  Midline and mobile  Adnexa/parametria:     Rt: Without masses or tenderness.   Lt: Without masses or tenderness.  Anus and perineum: Normal  Digital rectal exam: Normal sphincter tone without palpated masses or tenderness  Assessment/Plan:  46 y.o. MWF G2 P2 for annual exam with complaint of weight gain and hair thinning.  04/2017 Mirena  IUD amenorrheic HSV 1 rare outbreaks History of endometriosis Anxiety/depression stable on Lexapro   Plan: Valtrex 500 twice daily for 3-5 days as needed. Lexapro 20 mg by mouth daily prescription, proper use given and reviewed. Counseling as needed.  SBE's, continue annual screening mammogram, calcium rich diet, vitamin D 1000 daily and continue regular exercise encouraged. Has gained approximately 20 pounds over the last 4 years, is seeing a nutritionist. CBC, TSH, lipid panel, CMP. Pap normal 2016, new screening guidelines reviewed.    [place], 1:17 PM 08/11/2017

## 2017-08-12 ENCOUNTER — Encounter: Payer: Self-pay | Admitting: Women's Health

## 2017-09-09 ENCOUNTER — Other Ambulatory Visit: Payer: Self-pay | Admitting: Women's Health

## 2017-09-09 DIAGNOSIS — Z139 Encounter for screening, unspecified: Secondary | ICD-10-CM

## 2017-10-06 ENCOUNTER — Encounter: Payer: Self-pay | Admitting: Women's Health

## 2017-10-06 ENCOUNTER — Ambulatory Visit
Admission: RE | Admit: 2017-10-06 | Discharge: 2017-10-06 | Disposition: A | Discharge status: Home / Self Care | Payer: 59 | Source: Ambulatory Visit | Attending: Women's Health | Admitting: Women's Health

## 2017-10-06 DIAGNOSIS — Z1231 Encounter for screening mammogram for malignant neoplasm of breast: Secondary | ICD-10-CM | POA: Diagnosis not present

## 2017-10-06 DIAGNOSIS — Z139 Encounter for screening, unspecified: Secondary | ICD-10-CM

## 2017-11-25 MED FILL — ESCITALOPRAM 20 MG TABLET: 20 | 90 days supply | Qty: 90 | Fill #1

## 2017-11-25 MED FILL — FLUTICASONE PROP 50 MCG SPR: 50 | 30 days supply | Qty: 16 | Fill #1

## 2017-12-07 DIAGNOSIS — H33193 Other retinoschisis and retinal cysts, bilateral: Secondary | ICD-10-CM | POA: Diagnosis not present

## 2017-12-07 DIAGNOSIS — H2513 Age-related nuclear cataract, bilateral: Secondary | ICD-10-CM | POA: Diagnosis not present

## 2017-12-07 DIAGNOSIS — H43811 Vitreous degeneration, right eye: Secondary | ICD-10-CM | POA: Diagnosis not present

## 2018-02-22 ENCOUNTER — Encounter: Payer: Self-pay | Admitting: Women's Health

## 2018-02-25 MED FILL — FLUTICASONE PROP 50 MCG SPR: 50 | 30 days supply | Qty: 16 | Fill #2

## 2018-02-25 MED FILL — ESCITALOPRAM 20 MG TABLET: 20 | 90 days supply | Qty: 90 | Fill #2

## 2018-05-24 MED FILL — ESCITALOPRAM 20 MG TABLET: 20 | 90 days supply | Qty: 90 | Fill #3

## 2018-06-15 DIAGNOSIS — H43811 Vitreous degeneration, right eye: Secondary | ICD-10-CM | POA: Diagnosis not present

## 2018-06-15 DIAGNOSIS — H33193 Other retinoschisis and retinal cysts, bilateral: Secondary | ICD-10-CM | POA: Diagnosis not present

## 2018-08-15 ENCOUNTER — Ambulatory Visit (INDEPENDENT_AMBULATORY_CARE_PROVIDER_SITE_OTHER): Payer: 59 | Admitting: Women's Health

## 2018-08-15 ENCOUNTER — Encounter: Payer: Self-pay | Admitting: Women's Health

## 2018-08-15 VITALS — BP 118/72 | Ht 64.0 in | Wt 135.8 lb

## 2018-08-15 DIAGNOSIS — B001 Herpesviral vesicular dermatitis: Secondary | ICD-10-CM | POA: Diagnosis not present

## 2018-08-15 DIAGNOSIS — F3289 Other specified depressive episodes: Secondary | ICD-10-CM

## 2018-08-15 DIAGNOSIS — B009 Herpesviral infection, unspecified: Secondary | ICD-10-CM

## 2018-08-15 DIAGNOSIS — J301 Allergic rhinitis due to pollen: Secondary | ICD-10-CM

## 2018-08-15 DIAGNOSIS — Z01419 Encounter for gynecological examination (general) (routine) without abnormal findings: Secondary | ICD-10-CM | POA: Diagnosis not present

## 2018-08-15 DIAGNOSIS — Z1151 Encounter for screening for human papillomavirus (HPV): Secondary | ICD-10-CM | POA: Diagnosis not present

## 2018-08-15 DIAGNOSIS — Z13220 Encounter for screening for lipoid disorders: Secondary | ICD-10-CM | POA: Diagnosis not present

## 2018-08-15 LAB — CBC WITH DIFFERENTIAL/PLATELET
Basophils Absolute: 38 cells/uL (ref 0–200)
Basophils Relative: 1 %
Eosinophils Absolute: 209 cells/uL (ref 15–500)
Eosinophils Relative: 5.5 %
HEMATOCRIT: 38.1 % (ref 35.0–45.0)
HEMOGLOBIN: 13.1 g/dL (ref 11.7–15.5)
LYMPHS ABS: 1516 {cells}/uL (ref 850–3900)
MCH: 31.3 pg (ref 27.0–33.0)
MCHC: 34.4 g/dL (ref 32.0–36.0)
MCV: 91.1 fL (ref 80.0–100.0)
MPV: 11 fL (ref 7.5–12.5)
Monocytes Relative: 10.2 %
Neutro Abs: 1649 cells/uL (ref 1500–7800)
Neutrophils Relative %: 43.4 %
Platelets: 247 10*3/uL (ref 140–400)
RBC: 4.18 10*6/uL (ref 3.80–5.10)
RDW: 11.8 % (ref 11.0–15.0)
Total Lymphocyte: 39.9 %
WBC: 3.8 10*3/uL (ref 3.8–10.8)
WBCMIX: 388 {cells}/uL (ref 200–950)

## 2018-08-15 LAB — COMPREHENSIVE METABOLIC PANEL
AG RATIO: 2 (calc) (ref 1.0–2.5)
ALBUMIN MSPROF: 4.8 g/dL (ref 3.6–5.1)
ALT: 26 U/L (ref 6–29)
AST: 30 U/L (ref 10–35)
Alkaline phosphatase (APISO): 63 U/L (ref 33–115)
BUN: 16 mg/dL (ref 7–25)
CHLORIDE: 100 mmol/L (ref 98–110)
CO2: 30 mmol/L (ref 20–32)
CREATININE: 0.73 mg/dL (ref 0.50–1.10)
Calcium: 9.6 mg/dL (ref 8.6–10.2)
Globulin: 2.4 g/dL (calc) (ref 1.9–3.7)
Glucose, Bld: 75 mg/dL (ref 65–99)
Potassium: 4 mmol/L (ref 3.5–5.3)
Sodium: 137 mmol/L (ref 135–146)
Total Bilirubin: 1.3 mg/dL — ABNORMAL HIGH (ref 0.2–1.2)
Total Protein: 7.2 g/dL (ref 6.1–8.1)

## 2018-08-15 LAB — LIPID PANEL
CHOL/HDL RATIO: 2.6 (calc) (ref <5.0)
CHOLESTEROL: 209 mg/dL — AB (ref <200)
HDL: 79 mg/dL (ref >50)
LDL Cholesterol (Calc): 116 mg/dL (calc) — ABNORMAL HIGH
Non-HDL Cholesterol (Calc): 130 mg/dL (calc) — ABNORMAL HIGH (ref <130)
Triglycerides: 45 mg/dL (ref <150)

## 2018-08-15 MED ORDER — VALACYCLOVIR HCL 500 MG PO TABS: ORAL_TABLET | ORAL | 5 refills | Status: DC

## 2018-08-15 MED ORDER — ESCITALOPRAM OXALATE 20 MG PO TABS: 20.0000 mg | ORAL_TABLET | Freq: Every day | ORAL | 4 refills | Status: DC

## 2018-08-15 MED ORDER — FLUTICASONE PROPIONATE 50 MCG/ACT NA SUSP: 2.0000 | Freq: Every day | NASAL | 6 refills | Status: DC

## 2018-08-15 MED FILL — FLUTICASONE PROP 50 MCG SPR: 50 | 30 days supply | Qty: 16 | Fill #0

## 2018-08-15 MED FILL — ESCITALOPRAM 20 MG TABLET: 20 | 90 days supply | Qty: 90 | Fill #0

## 2018-08-15 MED FILL — VALACYCLOVIR HCL 500 MG TAB: 500 | 15 days supply | Qty: 30 | Fill #0

## 2018-08-15 NOTE — Progress Notes (Signed)
Dominique Powell 1971/05/02 [id number]   History: 47 yo WMF presents for annual exam. 2018 Mirena IUD, amenorrehic. History of endometriosis asymptomatic. HSV 1 rare outbreaks. Normal Pap and mammogram history. Patient has lost 12 lbs, following a diet and exercise plan.  Past medical history, past surgical history, family history and social history were all reviewed and documented in the EPIC chart. Nurse, at [employer]. Daughter 48 years old, Museum/gallery exhibitions officer at [employer]. Son 48 years old in [place]. Husband is a kidney transplant survivor, post op 3 years doing great.  ROS:  A ROS was performed and pertinent positives and negatives are included.  Exam:  Vitals:   08/15/18 0920  BP: 118/72  Weight: 135 lb 12.8 oz (61.6 kg)  Height: 5\' 4"  (1.626 m)   Body mass index is 23.31 kg/m.   General appearance:  Normal Thyroid:  Symmetrical, normal in size, without palpable masses or nodularity. Respiratory  Auscultation:  Clear without wheezing or rhonchi Cardiovascular  Auscultation:  Regular rate, without rubs, murmurs or gallops  Edema/varicosities:  Not grossly evident Abdominal  Soft,nontender, without masses, guarding or rebound.  Liver/spleen:  No organomegaly noted  Hernia:  None appreciated  Skin  Inspection:  Grossly normal   Breasts: Examined lying and sitting.     Right: Without masses, retractions, discharge or axillary adenopathy.     Left: Without masses, retractions, discharge or axillary adenopathy. Gentitourinary   Inguinal/mons:  Normal without inguinal adenopathy  External genitalia:  Normal  BUS/Urethra/Skene's glands:  Normal  Vagina:  Normal  Cervix:  Normal IUD strings visible.  Uterus:  normal in size, shape and contour.  Midline and mobile  Adnexa/parametria:     Rt: Without masses or tenderness.   Lt: Without masses or tenderness.  Anus and perineum: Normal  Digital rectal exam: Normal sphincter tone without palpated masses or  tenderness  Assessment/Plan:  47 y.o. MWF G2P2  for annual exam.  With occasional hot flashes at night.   05/2017 Mirena IUD amenorrheic History of endometriosis Anxiety/depression stable on Lexapro HSV 1  Plan: ContinueValtrex 500 twice daily for 3-5 days as needed. Lexapro 20 mg by mouth daily prescription.  Counseling as needed, leisure activities and continue regular exercise.  Discussed menopause, and possible hormonal treatment for hot flashes. Continue SBE's, annual screening mammogram, healthy lifestyle of regular exercise, calcium rich diet, vitamin D 1000 daily encouraged. CBC, lipid panel, CMP,  UA,  Pap  w/ HR HPV., new screening guidelines reviewed.    Maish Vaya, 9:27 AM 08/15/2018

## 2018-08-15 NOTE — Patient Instructions (Signed)

## 2018-08-17 LAB — PAP, TP IMAGING W/ HPV RNA, RFLX HPV TYPE 16,18/45: HPV DNA High Risk: NOT DETECTED

## 2018-10-25 ENCOUNTER — Other Ambulatory Visit: Payer: Self-pay | Admitting: Women's Health

## 2018-10-25 DIAGNOSIS — Z1231 Encounter for screening mammogram for malignant neoplasm of breast: Secondary | ICD-10-CM

## 2018-10-28 ENCOUNTER — Ambulatory Visit
Admission: RE | Admit: 2018-10-28 | Discharge: 2018-10-28 | Disposition: A | Discharge status: Home / Self Care | Payer: 59 | Source: Ambulatory Visit

## 2018-10-28 DIAGNOSIS — Z1231 Encounter for screening mammogram for malignant neoplasm of breast: Secondary | ICD-10-CM | POA: Diagnosis not present

## 2018-11-30 MED FILL — ESCITALOPRAM 20 MG TABLET: 20 | 90 days supply | Qty: 90 | Fill #0

## 2018-12-15 MED FILL — FLUTICASONE PROP 50 MCG SPR: 50 | 30 days supply | Qty: 16 | Fill #0

## 2019-01-10 MED FILL — FLUTICASONE PROP 50 MCG SPR: 50 | 30 days supply | Qty: 16 | Fill #1

## 2019-02-07 MED FILL — FLUTICASONE PROP 50 MCG SPR: 50 | 30 days supply | Qty: 16 | Fill #2

## 2019-02-22 MED FILL — ESCITALOPRAM 20 MG TABLET: 20 | 90 days supply | Qty: 90 | Fill #1

## 2019-04-17 ENCOUNTER — Encounter: Payer: Self-pay | Admitting: Women's Health

## 2019-05-22 DIAGNOSIS — H52203 Unspecified astigmatism, bilateral: Secondary | ICD-10-CM | POA: Diagnosis not present

## 2019-05-23 MED FILL — ESCITALOPRAM 20 MG TABLET: 20 | 90 days supply | Qty: 90 | Fill #2

## 2019-05-30 ENCOUNTER — Encounter: Payer: Self-pay | Admitting: Gynecology

## 2019-06-02 MED FILL — ESCITALOPRAM 20 MG TABLET: 20 | 90 days supply | Qty: 90 | Fill #2

## 2019-06-14 DIAGNOSIS — H33193 Other retinoschisis and retinal cysts, bilateral: Secondary | ICD-10-CM | POA: Diagnosis not present

## 2019-06-14 DIAGNOSIS — H43811 Vitreous degeneration, right eye: Secondary | ICD-10-CM | POA: Diagnosis not present

## 2019-06-14 DIAGNOSIS — H2513 Age-related nuclear cataract, bilateral: Secondary | ICD-10-CM | POA: Diagnosis not present

## 2019-08-14 ENCOUNTER — Other Ambulatory Visit: Payer: Self-pay

## 2019-08-14 DIAGNOSIS — J301 Allergic rhinitis due to pollen: Secondary | ICD-10-CM

## 2019-08-14 MED ORDER — FLUTICASONE PROPIONATE 50 MCG/ACT NA SUSP: 2.0000 | Freq: Every day | NASAL | 12 refills | Status: DC

## 2019-08-14 MED FILL — FLUTICASONE PROP 50 MCG SPR: 50 | 30 days supply | Qty: 16 | Fill #0

## 2019-08-14 NOTE — Telephone Encounter (Signed)
CE scheduled 08/21/2019.

## 2019-08-14 NOTE — Telephone Encounter (Signed)
Okay for refill?  

## 2019-08-21 ENCOUNTER — Encounter: Payer: Self-pay | Admitting: Women's Health

## 2019-08-21 ENCOUNTER — Other Ambulatory Visit: Payer: Self-pay

## 2019-08-21 ENCOUNTER — Ambulatory Visit (INDEPENDENT_AMBULATORY_CARE_PROVIDER_SITE_OTHER): Payer: 59 | Admitting: Women's Health

## 2019-08-21 VITALS — BP 110/78 | Ht 64.0 in | Wt 147.0 lb

## 2019-08-21 DIAGNOSIS — Z01419 Encounter for gynecological examination (general) (routine) without abnormal findings: Secondary | ICD-10-CM | POA: Diagnosis not present

## 2019-08-21 DIAGNOSIS — B009 Herpesviral infection, unspecified: Secondary | ICD-10-CM

## 2019-08-21 DIAGNOSIS — F3289 Other specified depressive episodes: Secondary | ICD-10-CM

## 2019-08-21 DIAGNOSIS — Z13220 Encounter for screening for lipoid disorders: Secondary | ICD-10-CM | POA: Diagnosis not present

## 2019-08-21 LAB — CBC WITH DIFFERENTIAL/PLATELET
HCT: 38.3 % (ref 35.0–45.0)
MCV: 92.5 fL (ref 80.0–100.0)
Total Lymphocyte: 28.2 %

## 2019-08-21 MED ORDER — ESCITALOPRAM OXALATE 20 MG PO TABS: 20.0000 mg | ORAL_TABLET | Freq: Every day | ORAL | 4 refills | Status: DC

## 2019-08-21 MED ORDER — VALACYCLOVIR HCL 500 MG PO TABS: ORAL_TABLET | ORAL | 5 refills | Status: DC

## 2019-08-21 MED FILL — ESCITALOPRAM 20 MG TABLET: 20 | 90 days supply | Qty: 90 | Fill #0

## 2019-08-21 MED FILL — VALACYCLOVIR HCL 500 MG TAB: 500 | 30 days supply | Qty: 30 | Fill #0

## 2019-08-21 NOTE — Patient Instructions (Addendum)
Vit D  2000 iu and Vit E twice daily Good to see you today!  Health Maintenance, Female Adopting a healthy lifestyle and getting preventive care are important in promoting health and wellness. Ask your health care provider about:  The right schedule for you to have regular tests and exams.  Things you can do on your own to prevent diseases and keep yourself healthy. What should I know about diet, weight, and exercise? Eat a healthy diet   Eat a diet that includes plenty of vegetables, fruits, low-fat dairy products, and lean protein.  Do not eat a lot of foods that are high in solid fats, added sugars, or sodium. Maintain a healthy weight Body mass index (BMI) is used to identify weight problems. It estimates body fat based on height and weight. Your health care provider can help determine your BMI and help you achieve or maintain a healthy weight. Get regular exercise Get regular exercise. This is one of the most important things you can do for your health. Most adults should:  Exercise for at least 150 minutes each week. The exercise should increase your heart rate and make you sweat (moderate-intensity exercise).  Do strengthening exercises at least twice a week. This is in addition to the moderate-intensity exercise.  Spend less time sitting. Even light physical activity can be beneficial. Watch cholesterol and blood lipids Have your blood tested for lipids and cholesterol at 49 years of age, then have this test every 5 years. Have your cholesterol levels checked more often if:  Your lipid or cholesterol levels are high.  You are older than 49 years of age.  You are at high risk for heart disease. What should I know about cancer screening? Depending on your health history and family history, you may need to have cancer screening at various ages. This may include screening for:  Breast cancer.  Cervical cancer.  Colorectal cancer.  Skin cancer.  Lung cancer. What  should I know about heart disease, diabetes, and high blood pressure? Blood pressure and heart disease  High blood pressure causes heart disease and increases the risk of stroke. This is more likely to develop in people who have high blood pressure readings, are of African descent, or are overweight.  Have your blood pressure checked: ? Every 3-5 years if you are 2-60 years of age. ? Every year if you are 80 years old or older. Diabetes Have regular diabetes screenings. This checks your fasting blood sugar level. Have the screening done:  Once every three years after age 15 if you are at a normal weight and have a low risk for diabetes.  More often and at a younger age if you are overweight or have a high risk for diabetes. What should I know about preventing infection? Hepatitis B If you have a higher risk for hepatitis B, you should be screened for this virus. Talk with your health care provider to find out if you are at risk for hepatitis B infection. Hepatitis C Testing is recommended for:  Everyone born from [birth year] through 1966.  Anyone with known risk factors for hepatitis C. Sexually transmitted infections (STIs)  Get screened for STIs, including gonorrhea and chlamydia, if: ? You are sexually active and are younger than 49 years of age. ? You are older than 49 years of age and your health care provider tells you that you are at risk for this type of infection. ? Your sexual activity has changed since you were last screened,  and you are at increased risk for chlamydia or gonorrhea. Ask your health care provider if you are at risk.  Ask your health care provider about whether you are at high risk for HIV. Your health care provider may recommend a prescription medicine to help prevent HIV infection. If you choose to take medicine to prevent HIV, you should first get tested for HIV. You should then be tested every 3 months for as long as you are taking the medicine. Pregnancy  If  you are about to stop having your period (premenopausal) and you may become pregnant, seek counseling before you get pregnant.  Take 400 to 800 micrograms (mcg) of folic acid every day if you become pregnant.  Ask for birth control (contraception) if you want to prevent pregnancy. Osteoporosis and menopause Osteoporosis is a disease in which the bones lose minerals and strength with aging. This can result in bone fractures. If you are 48 years old or older, or if you are at risk for osteoporosis and fractures, ask your health care provider if you should:  Be screened for bone loss.  Take a calcium or vitamin D supplement to lower your risk of fractures.  Be given hormone replacement therapy (HRT) to treat symptoms of menopause. Follow these instructions at home: Lifestyle  Do not use any products that contain nicotine or tobacco, such as cigarettes, e-cigarettes, and chewing tobacco. If you need help quitting, ask your health care provider.  Do not use street drugs.  Do not share needles.  Ask your health care provider for help if you need support or information about quitting drugs. Alcohol use  Do not drink alcohol if: ? Your health care provider tells you not to drink. ? You are pregnant, may be pregnant, or are planning to become pregnant.  If you drink alcohol: ? Limit how much you use to 0-1 drink a day. ? Limit intake if you are breastfeeding.  Be aware of how much alcohol is in your drink. In the [place] one drink equals one 12 oz bottle of beer (355 mL), one 5 oz glass of wine (148 mL), or one 1 oz glass of hard liquor (44 mL). General instructions  Schedule regular health, dental, and eye exams.  Stay current with your vaccines.  Tell your health care provider if: ? You often feel depressed. ? You have ever been abused or do not feel safe at home. Summary  Adopting a healthy lifestyle and getting preventive care are important in promoting health and wellness.   Follow your health care provider's instructions about healthy diet, exercising, and getting tested or screened for diseases.  Follow your health care provider's instructions on monitoring your cholesterol and blood pressure. This information is not intended to replace advice given to you by your health care provider. Make sure you discuss any questions you have with your health care provider. Document Released: 03/09/2011 Document Revised: 08/17/2018 Document Reviewed: 08/17/2018 Elsevier Patient Education  2020 [employer].

## 2019-08-21 NOTE — Progress Notes (Addendum)
Dominique Powell 49-May-1972 [id number]    History:    Presents for annual exam.  04/2017 Mirena IUD amenorrheic.  History of an endometriosis and infertility with minimal pain.  Anxiety/depression stable on Lexapro.  Normal Pap and mammogram history.  History of a retinal eye issue ophthalmologist manages  Past medical history, past surgical history, family history and social history were all reviewed and documented in the EPIC chart.  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  Husbnd kidney transplant 4 years ago doing well polycystic kidney disease.  Daughter 70 at  [facility] son 53 both doing well.  ROS:  A ROS was performed and pertinent positives and negatives are included.  Exam:  Vitals:   08/21/19 0947  BP: 110/78  Weight: 147 lb (66.7 kg)  Height: 5\' 4"  (1.626 m)   Body mass index is 25.23 kg/m.   General appearance:  Normal Thyroid:  Symmetrical, normal in size, without palpable masses or nodularity. Respiratory  Auscultation:  Clear without wheezing or rhonchi Cardiovascular  Auscultation:  Regular rate, without rubs, murmurs or gallops  Edema/varicosities:  Not grossly evident Abdominal  Soft,nontender, without masses, guarding or rebound.  Liver/spleen:  No organomegaly noted  Hernia:  None appreciated  Skin  Inspection:  Grossly normal   Breasts: Examined lying and sitting. Bilateral saline implants    Right: Without masses, retractions, discharge or axillary adenopathy.     Left: Without masses, retractions, discharge or axillary adenopathy. Gentitourinary   Inguinal/mons:  Normal without inguinal adenopathy  External genitalia:  Normal  BUS/Urethra/Skene's glands:  Normal  Vagina:  Normal  Cervix:  Normal IUD strings visible  Uterus:   normal in size, shape and contour.  Midline and mobile  Adnexa/parametria:     Rt: Without masses or tenderness.   Lt: Without masses or tenderness.  Anus and perineum: Normal  Digital rectal exam: Normal sphincter tone without palpated masses or  tenderness  Assessment/Plan:  48 y.o. MWF G2 P2 for annual exam with no complaints.  04/2017 Mirena IUD amenorrheic minimal menopausal symptoms HSV 1 rare outbreaks Anxiety/depression stable on Lexapro  Plan: Lexapro 20 mg p.o. daily prescription, proper use given and reviewed continue self-care, leisure activities encouraged.  Counseling as needed.  SBEs, continue annual 3D screening mammogram, calcium rich foods, vitamin D 2000 IUs daily.  Menopause discussed having occasional hot flushes.  Had lost 12 pounds last year has regained, reviewed importance of continuing Jazzercise which she does on a regular basis, low calorie/carb diet.  Valtrex 500 twice daily for 3 to 5 days as needed prescription, proper use given and reviewed.  CBC, CMP, lipid panel, Pap normal 2019, new screening guidelines reviewed.    Huel Cote [facility], 1:18 PM 08/21/2019

## 2019-08-22 LAB — CBC WITH DIFFERENTIAL/PLATELET
Absolute Monocytes: 422 cells/uL (ref 200–950)
Basophils Absolute: 38 cells/uL (ref 0–200)
Basophils Relative: 0.8 %
Eosinophils Absolute: 245 cells/uL (ref 15–500)
Eosinophils Relative: 5.1 %
Hemoglobin: 12.9 g/dL (ref 11.7–15.5)
Lymphs Abs: 1354 cells/uL (ref 850–3900)
MCH: 31.2 pg (ref 27.0–33.0)
MCHC: 33.7 g/dL (ref 32.0–36.0)
MPV: 11.2 fL (ref 7.5–12.5)
Monocytes Relative: 8.8 %
Neutro Abs: 2741 cells/uL (ref 1500–7800)
Neutrophils Relative %: 57.1 %
Platelets: 250 10*3/uL (ref 140–400)
RBC: 4.14 10*6/uL (ref 3.80–5.10)
RDW: 11.8 % (ref 11.0–15.0)
WBC: 4.8 10*3/uL (ref 3.8–10.8)

## 2019-08-22 LAB — COMPREHENSIVE METABOLIC PANEL
AG Ratio: 1.8 (calc) (ref 1.0–2.5)
ALT: 24 U/L (ref 6–29)
AST: 24 U/L (ref 10–35)
Albumin: 4.5 g/dL (ref 3.6–5.1)
Alkaline phosphatase (APISO): 61 U/L (ref 31–125)
BUN: 13 mg/dL (ref 7–25)
CO2: 29 mmol/L (ref 20–32)
Calcium: 9.6 mg/dL (ref 8.6–10.2)
Chloride: 100 mmol/L (ref 98–110)
Creat: 0.77 mg/dL (ref 0.50–1.10)
Globulin: 2.5 g/dL (calc) (ref 1.9–3.7)
Glucose, Bld: 83 mg/dL (ref 65–99)
Potassium: 4.2 mmol/L (ref 3.5–5.3)
Sodium: 139 mmol/L (ref 135–146)
Total Bilirubin: 1.1 mg/dL (ref 0.2–1.2)
Total Protein: 7 g/dL (ref 6.1–8.1)

## 2019-08-22 LAB — LIPID PANEL
Cholesterol: 210 mg/dL — ABNORMAL HIGH (ref <200)
HDL: 77 mg/dL (ref >=50)
LDL Cholesterol (Calc): 119 mg/dL (calc) — ABNORMAL HIGH
Non-HDL Cholesterol (Calc): 133 mg/dL (calc) — ABNORMAL HIGH (ref <130)
Total CHOL/HDL Ratio: 2.7 (calc) (ref <5.0)
Triglycerides: 57 mg/dL (ref <150)

## 2019-08-23 LAB — URINALYSIS, COMPLETE W/RFL CULTURE
Bacteria, UA: NONE SEEN /HPF
Bilirubin Urine: NEGATIVE
Glucose, UA: NEGATIVE
Hgb urine dipstick: NEGATIVE
Hyaline Cast: NONE SEEN /LPF
Ketones, ur: NEGATIVE
Nitrites, Initial: NEGATIVE
Protein, ur: NEGATIVE
Specific Gravity, Urine: 1.021 (ref 1.001–1.03)
pH: 6 (ref 5.0–8.0)

## 2019-08-23 LAB — URINE CULTURE
MICRO NUMBER:: 1196310
SPECIMEN QUALITY:: ADEQUATE

## 2019-08-23 LAB — CULTURE INDICATED

## 2019-10-23 ENCOUNTER — Other Ambulatory Visit: Payer: Self-pay | Admitting: Women's Health

## 2019-10-23 DIAGNOSIS — Z1231 Encounter for screening mammogram for malignant neoplasm of breast: Secondary | ICD-10-CM

## 2019-10-31 ENCOUNTER — Other Ambulatory Visit: Payer: Self-pay

## 2019-10-31 ENCOUNTER — Ambulatory Visit (INDEPENDENT_AMBULATORY_CARE_PROVIDER_SITE_OTHER)
Admission: RE | Admit: 2019-10-31 | Discharge: 2019-10-31 | Disposition: A | Discharge status: Home / Self Care | Payer: 59 | Source: Ambulatory Visit

## 2019-10-31 DIAGNOSIS — H1032 Unspecified acute conjunctivitis, left eye: Secondary | ICD-10-CM

## 2019-10-31 DIAGNOSIS — H00014 Hordeolum externum left upper eyelid: Secondary | ICD-10-CM

## 2019-10-31 MED ORDER — POLYMYXIN B-TRIMETHOPRIM 10000-0.1 UNIT/ML-% OP SOLN: 1.0000 [drp] | Freq: Four times a day (QID) | OPHTHALMIC | 0 refills | Status: AC

## 2019-10-31 MED FILL — POLYMYXIN B/TMP EYE DROPS: 10000-0.1 | 7 days supply | Qty: 10 | Fill #0

## 2019-10-31 NOTE — Discharge Instructions (Signed)
Use the antibiotic eye drops as directed.    Follow-up with your primary care provider or your eye doctor or come here to be seen in person if your symptoms are not improving.    Go to the emergency department if you develop acute eye pain or changes in your vision.

## 2019-10-31 NOTE — ED Provider Notes (Signed)
Virtual Visit via Video Note:  Dominique Powell  initiated request for Telemedicine visit with [facility] Urgent Care team. I connected with Dominique Powell  on 10/31/2019 at 10:41 AM  for a synchronized telemedicine visit using a video enabled HIPPA compliant telemedicine application. I verified that I am speaking with Dominique Powell  using two identifiers. [name], NP  was physically located in a [facility] Urgent care site and [name] was located at a different location.   The limitations of evaluation and management by telemedicine as well as the availability of in-person appointments were discussed. Patient was informed that she  may incur a bill ( including co-pay) for this virtual visit encounter. Dominique Powell  expressed understanding and gave verbal consent to proceed with virtual visit.     History of Present Illness:Dominique Powell  is a 49 y.o. female presents for evaluation of stye on left upper eyelid x 4 days.  She has been treating it at home with warm compresses and ibuprofen.  She denies eye pain or changes in vision.  She also reports crusting and drainage from her left eye.  She denies fever, chills, or other symptoms.     No Known Allergies   Past Medical History:  Diagnosis Date  . Infertility   . Kidney stone   . Retinoschisis      Social History   Tobacco Use  . Smoking status: Never Smoker  . Smokeless tobacco: Never Used  Substance Use Topics  . Alcohol use: Yes  . Drug use: No    ROS: as stated in HPI.  All other systems reviewed and negative.      Observations/Objective: Physical Exam  VITALS: Patient denies fever. GENERAL: Alert, appears well and in no acute distress. HEENT: Atraumatic.  Stye on left upper eyelid.   NECK: Normal movements of the head and neck. CARDIOPULMONARY: No increased WOB. Speaking in clear sentences. I:E ratio WNL.  MS: Moves all visible extremities without noticeable  abnormality. PSYCH: Pleasant and cooperative, well-groomed. Speech normal rate and rhythm. Affect is appropriate. Insight and judgement are appropriate. Attention is focused, linear, and appropriate.  NEURO: CN grossly intact. Oriented as arrived to appointment on time with no prompting. Moves both UE equally.  SKIN: No obvious lesions, wounds, erythema, or cyanosis noted on face or hands.   Assessment and Plan:    ICD-10-CM   1. Hordeolum externum of left upper eyelid  H00.014   2. Acute conjunctivitis of left eye, unspecified acute conjunctivitis type  H10.32        Follow Up Instructions: Treating with Polytrim eyedrops.  Encourage patient to continue using warm compresses several times a day and ibuprofen as needed.  Instructed patient to go to the emergency department if she develops acute eye pain or changes in her vision.  Instructed her to follow-up with her PCP or eye doctor or come here to be seen in person if her symptoms are not improving.  Patient agrees to plan of care.      I discussed the assessment and treatment plan with the patient. The patient was provided an opportunity to ask questions and all were answered. The patient agreed with the plan and demonstrated an understanding of the instructions.   The patient was advised to call back or seek an in-person evaluation if the symptoms worsen or if the condition fails to improve as anticipated.      [name], NP  10/31/2019  10:41 AM         [name], NP 10/31/19 1042

## 2019-11-24 ENCOUNTER — Ambulatory Visit: Payer: 59

## 2019-12-01 MED FILL — ESCITALOPRAM 20 MG TABLET: 20 | 90 days supply | Qty: 90 | Fill #1

## 2019-12-01 MED FILL — FLUTICASONE PROP 50 MCG SPR: 50 | 30 days supply | Qty: 16 | Fill #1

## 2019-12-06 ENCOUNTER — Other Ambulatory Visit: Payer: Self-pay

## 2019-12-06 ENCOUNTER — Ambulatory Visit
Admission: RE | Admit: 2019-12-06 | Discharge: 2019-12-06 | Disposition: A | Discharge status: Home / Self Care | Payer: 59 | Source: Ambulatory Visit | Attending: Women's Health | Admitting: Women's Health

## 2019-12-06 DIAGNOSIS — Z1231 Encounter for screening mammogram for malignant neoplasm of breast: Secondary | ICD-10-CM

## 2019-12-08 ENCOUNTER — Other Ambulatory Visit: Payer: Self-pay | Admitting: Women's Health

## 2019-12-08 DIAGNOSIS — R928 Other abnormal and inconclusive findings on diagnostic imaging of breast: Secondary | ICD-10-CM

## 2019-12-15 ENCOUNTER — Ambulatory Visit (INDEPENDENT_AMBULATORY_CARE_PROVIDER_SITE_OTHER): Payer: 59

## 2019-12-15 ENCOUNTER — Ambulatory Visit (HOSPITAL_COMMUNITY): Payer: 59

## 2019-12-15 ENCOUNTER — Other Ambulatory Visit: Payer: Self-pay

## 2019-12-15 ENCOUNTER — Ambulatory Visit (HOSPITAL_COMMUNITY)
Admission: EM | Admit: 2019-12-15 | Discharge: 2019-12-15 | Disposition: A | Discharge status: Home / Self Care | Payer: 59

## 2019-12-15 ENCOUNTER — Encounter (HOSPITAL_COMMUNITY): Payer: Self-pay | Admitting: Urgent Care

## 2019-12-15 DIAGNOSIS — M7021 Olecranon bursitis, right elbow: Secondary | ICD-10-CM

## 2019-12-15 DIAGNOSIS — M25521 Pain in right elbow: Secondary | ICD-10-CM

## 2019-12-15 DIAGNOSIS — M25421 Effusion, right elbow: Secondary | ICD-10-CM

## 2019-12-15 DIAGNOSIS — S59901A Unspecified injury of right elbow, initial encounter: Secondary | ICD-10-CM

## 2019-12-15 DIAGNOSIS — S4991XA Unspecified injury of right shoulder and upper arm, initial encounter: Secondary | ICD-10-CM | POA: Diagnosis not present

## 2019-12-15 DIAGNOSIS — M7989 Other specified soft tissue disorders: Secondary | ICD-10-CM

## 2019-12-15 DIAGNOSIS — S46911A Strain of unspecified muscle, fascia and tendon at shoulder and upper arm level, right arm, initial encounter: Secondary | ICD-10-CM

## 2019-12-15 MED ORDER — NAPROXEN 500 MG PO TABS: 500.0000 mg | ORAL_TABLET | Freq: Two times a day (BID) | ORAL | 0 refills | Status: DC

## 2019-12-15 MED FILL — NAPROXEN 500 MG TABLET: 500 | 15 days supply | Qty: 30 | Fill #0

## 2019-12-15 NOTE — ED Triage Notes (Signed)
Pt c/o right elbow/arm swelling onset Wednesday night after participating in physical exercise class (arm focus) on Tuesday/Wednesday. Pt denies pain to arm/hand.  Right elbow fluctuant edema noted; with edema extending to forearm. +2 radial pulse; <3 sec cap refill to all fingers.

## 2019-12-15 NOTE — ED Provider Notes (Signed)
[place]   MRN: [id number] DOB: 12-06-1970  Subjective:   Dominique Powell is a 49 y.o. female presenting for 3-day history of persistent right arm and elbow swelling.  Patient states that symptoms started after she was at camp gym class.  She recently started this and is only gone to about 4 classes.  They are fast-paced and have her do strenuous exercises.  The only inciting event she can recall is that she was doing push-ups and her elbow gave out.  She subsequently developed swelling of her arm with more prominent swelling at her elbow.  Has not had pain.  Denies redness, warmth.  Has tried 1 ibuprofen, 1 Tylenol without any difference in her symptoms.   Current Facility-Administered Medications:  .  levonorgestrel (MIRENA) 20 MCG/24HR IUD, , Intrauterine, Once, Gottsegen, Cherly Anderson, MD  Current Outpatient Medications:  .  ADVIL 200 MG tablet, Take 400 mg by mouth every 6 (six) hours as needed. , Disp: , Rfl:  .  cetirizine (ZYRTEC) 10 MG tablet, Take 10 mg by mouth daily., Disp: , Rfl:  .  Cholecalciferol (VITAMIN D PO), Take by mouth., Disp: , Rfl:  .  escitalopram (LEXAPRO) 20 MG tablet, Take 1 tablet (20 mg total) by mouth daily., Disp: 90 tablet, Rfl: 4 .  fluticasone (FLONASE) 50 MCG/ACT nasal spray, Place 2 sprays into both nostrils daily., Disp: 16 g, Rfl: 12 .  Multiple Vitamin (MULTIVITAMIN) tablet, Take 1 tablet by mouth daily., Disp: , Rfl:  .  Omega-3 Fatty Acids (FISH OIL) 1000 MG CAPS, , Disp: , Rfl:  .  valACYclovir (VALTREX) 500 MG tablet, Take 1 tablet by mouth twice a day for 3-5 days as needed, Disp: 30 tablet, Rfl: 5   No Known Allergies  Past Medical History:  Diagnosis Date  . Infertility   . Kidney stone   . Retinoschisis      Past Surgical History:  Procedure Laterality Date  . AUGMENTATION MAMMAPLASTY Bilateral   . INTRAUTERINE DEVICE INSERTION  04/23/2017  . PELVIC LAPAROSCOPY     Endometriosis    Family History  Problem Relation  Age of Onset  . Hypertension Maternal Grandmother   . Hypertension Paternal Grandfather   . Heart attack Paternal Grandmother   . Breast cancer Neg Hx     Social History   Tobacco Use  . Smoking status: Never Smoker  . Smokeless tobacco: Never Used  Substance Use Topics  . Alcohol use: Yes  . Drug use: No    ROS   Objective:   Vitals: BP 110/61 (BP Location: Left Arm)   Temp 98.4 F (36.9 C) (Oral)   Resp 18   SpO2 100%   Physical Exam Constitutional:      General: She is not in acute distress.    Appearance: Normal appearance. She is well-developed. She is not ill-appearing, toxic-appearing or diaphoretic.  HENT:     Head: Normocephalic and atraumatic.     Nose: Nose normal.     Mouth/Throat:     Mouth: Mucous membranes are moist.     Pharynx: Oropharynx is clear.  Eyes:     General: No scleral icterus.    Extraocular Movements: Extraocular movements intact.     Pupils: Pupils are equal, round, and reactive to light.  Cardiovascular:     Rate and Rhythm: Normal rate.  Pulmonary:     Effort: Pulmonary effort is normal.  Musculoskeletal:     Right shoulder: No swelling, deformity, effusion, laceration, tenderness,  bony tenderness or crepitus. Normal range of motion. Normal strength.     Right upper arm: No swelling, edema, deformity, lacerations, tenderness or bony tenderness.     Right elbow: Swelling (worst over olecranon bursa) present. No deformity or lacerations. Normal range of motion. No tenderness.     Right forearm: No swelling, edema, deformity, lacerations, tenderness or bony tenderness.     Right wrist: No swelling, deformity, effusion, lacerations, tenderness, bony tenderness, snuff box tenderness or crepitus. Normal range of motion.  Skin:    General: Skin is warm and dry.  Neurological:     General: No focal deficit present.     Mental Status: She is alert and oriented to person, place, and time.  Psychiatric:        Mood and Affect: Mood  normal.        Behavior: Behavior normal.        Thought Content: Thought content normal.        Judgment: Judgment normal.     DG Elbow Complete Right  Result Date: 12/15/2019 CLINICAL DATA:  Elbow pain and swelling over the last 36 hours. Possible exercise injury. EXAM: RIGHT ELBOW - COMPLETE 3+ VIEW COMPARISON:  None. FINDINGS: Nonspecific regional soft tissue swelling. No evidence of fracture or dislocation. No degenerative change or other specific focal finding. No visible joint effusion. IMPRESSION: Nonspecific regional soft tissue swelling. Electronically Signed   By: Nelson Chimes M.D.   On: 12/15/2019 11:41    Assessment and Plan :   1. Arm injury, right, initial encounter   2. Elbow injury, right, initial encounter   3. Elbow swelling, right   4. Elbow strain, right, initial encounter   5. Olecranon bursitis of right elbow   6. Swelling of right upper extremity     Patient has very low risk factors for blood clot, physical exam findings not consistent with compartment syndrome.  Prior to her boot camp exercises, patient was not working out.  Suspect right upper arm injury, muscle strain secondary to very physically strenuous workouts.  Use conservative management, naproxen, rest. Counseled patient on potential for adverse effects with medications prescribed/recommended today, ER and return-to-clinic precautions discussed, patient verbalized understanding.    Jaynee Eagles, [place] 12/15/19 1207

## 2019-12-21 ENCOUNTER — Ambulatory Visit
Admission: RE | Admit: 2019-12-21 | Discharge: 2019-12-21 | Disposition: A | Discharge status: Home / Self Care | Payer: 59 | Source: Ambulatory Visit | Attending: Women's Health | Admitting: Women's Health

## 2019-12-21 ENCOUNTER — Other Ambulatory Visit: Payer: Self-pay | Admitting: Women's Health

## 2019-12-21 ENCOUNTER — Other Ambulatory Visit: Payer: Self-pay

## 2019-12-21 DIAGNOSIS — R922 Inconclusive mammogram: Secondary | ICD-10-CM | POA: Diagnosis not present

## 2019-12-21 DIAGNOSIS — R928 Other abnormal and inconclusive findings on diagnostic imaging of breast: Secondary | ICD-10-CM

## 2019-12-21 DIAGNOSIS — N6011 Diffuse cystic mastopathy of right breast: Secondary | ICD-10-CM | POA: Diagnosis not present

## 2019-12-29 ENCOUNTER — Ambulatory Visit: Payer: 59 | Admitting: Family Medicine

## 2019-12-29 ENCOUNTER — Other Ambulatory Visit: Payer: Self-pay

## 2019-12-29 ENCOUNTER — Encounter: Payer: Self-pay | Admitting: Family Medicine

## 2019-12-29 VITALS — BP 120/70 | HR 87 | Temp 97.3°F | Ht 65.5 in | Wt 153.4 lb

## 2019-12-29 DIAGNOSIS — F419 Anxiety disorder, unspecified: Secondary | ICD-10-CM

## 2019-12-29 DIAGNOSIS — E782 Mixed hyperlipidemia: Secondary | ICD-10-CM | POA: Insufficient documentation

## 2019-12-29 DIAGNOSIS — E7800 Pure hypercholesterolemia, unspecified: Secondary | ICD-10-CM | POA: Diagnosis not present

## 2019-12-29 DIAGNOSIS — K5900 Constipation, unspecified: Secondary | ICD-10-CM | POA: Insufficient documentation

## 2019-12-29 DIAGNOSIS — F32A Depression, unspecified: Secondary | ICD-10-CM

## 2019-12-29 DIAGNOSIS — F329 Major depressive disorder, single episode, unspecified: Secondary | ICD-10-CM | POA: Diagnosis not present

## 2019-12-29 MED ORDER — POLYETHYLENE GLYCOL 3350 17 GM/SCOOP PO POWD: 17.0000 g | Freq: Every day | ORAL | 1 refills | Status: DC

## 2019-12-29 NOTE — Progress Notes (Signed)
Subjective:    Patient ID: Dominique Powell, female    DOB: 01/16/1971, 49 y.o.   MRN: [id number]  HPI Chief Complaint  Patient presents with  . new pt    new pt get established. some conspitation issues for the last 6 months.    She is new to the practice and here to establish care.  Previous medical care: OB/GYN   Other providers: [facility]- OB/GYN   Complains of an 8 month history of constipation symptoms. States she is having harder stools than usual and her bowel movements have become more irregular. Typically she has a daily bowel movement.  Overall has been more sedentary over the past year but is getting back into her regular work out routine.  She changed her diet to increase her fiber and the constipation got worse. Coffee which usually helps no longer helps.  She is "slightly lactose intolerance". She was drinking a milkshake to help her have a bowel movement and it worked for a little while without causing too many other GI symptoms but now that is not working.  She denies fever, chills, new night sweats (other than premenopausal),  unexplained weight loss, chest pain, new palpitations (history of these), abdominal pain, nausea, vomiting. No blood in her stools. No rectal pain or history of hemorrhoids. States she avoids straining to prevent these.   She has not yet had a colonoscopy. Plans to do this when she turns 49 later this year.   Anxiety and depression- reports taking Lexapro 10 mg daily.  Started on this when she had stress related to husband having bilateral nephrectomy and kidney transplant. He is doing well now. She is happily married.   HL- taking fish oil. Has never needed a statin.    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  Denies smoking or drug use. Beer on the weekend.  Excerise: just started getting back to her workouts  States she is up to date with  mammogram, pap smear and dental visits.    Reviewed allergies, medications, past medical, surgical, family, and social history.    Review of Systems Pertinent positives and negatives in the history of present illness.     Objective:   Physical Exam BP 120/70   Pulse 87   Temp (!) 97.3 F (36.3 C)   Ht 5' 5.5" (1.664 m)   Wt 153 lb 6.4 oz (69.6 kg)   BMI 25.14 kg/m   Alert and oriented and in no distress.  Neck is supple without adenopathy or thyromegaly. Cardiac exam shows a regular sinus rhythm without murmurs or gallops. Lungs are clear to auscultation. Abdomen is soft, non distended, non tender, without rebound or guarding, normal BS, no palpable masses. Extremities without edema. Skin is warm and dry. Normal mood.       Assessment & Plan:  Constipation, unspecified constipation type - Plan: polyethylene glycol powder (GLYCOLAX/MIRALAX) 17 GM/SCOOP powder -She is new to the practice and here to establish care.  Approximately 91-month history of constipation.  Suspect this is due to decreased activity and potentially not being as well hydrated as usual due to mask wearing.  No red flag symptoms.  Her abdominal exam is benign.  Discussed treatment options for constipation including taking a stool softener, probiotic or once daily MiraLAX for now.  Discussed how to titrate the MiraLAX especially if her stools become too frequent or loose.  Discussed choosing 1 method and not doing all 3.  Encouraged good hydration and regular exercise.  She will let me know if she is having any new or worsening symptoms or if her treatment does not work.  She will be due for her screening colonoscopy soon.  Consider referral to GI if needed sooner.  Anxiety and depression -She is interested in coming off of the Lexapro at some point but for now she will continue on 10 mg daily.  Pure hypercholesterolemia -Discussed healthy diet and exercise.  No significant risk factors for heart disease.  Her HDL is  good.  She is taking fish oil daily.  We will check her lipids at her CPE.

## 2019-12-29 NOTE — Patient Instructions (Signed)
It was a pleasure meeting you today.  Make sure you are drinking plenty of water, 64 ounces throughout the day.  Try to get at least 150 minutes of vigorous physical activity each week.  You could try taking 1 or 2 stool softeners daily, an over-the-counter probiotic such as Electronics engineer or [employer]est[employer].  You may want to just try taking the MiraLAX, 1 capful in at least 6 ounces of fluid daily.  If you are not seeing any improvement or if you have any new or worsening symptoms, let me know.  You may schedule a fasting CPE at your convenience.     Constipation, Adult Constipation is when a person has fewer bowel movements in a week than normal, has difficulty having a bowel movement, or has stools that are dry, hard, or larger than normal. Constipation may be caused by an underlying condition. It may become worse with age if a person takes certain medicines and does not take in enough fluids. Follow these instructions at home: Eating and drinking   Eat foods that have a lot of fiber, such as fresh fruits and vegetables, whole grains, and beans.  Limit foods that are high in fat, low in fiber, or overly processed, such as french fries, hamburgers, cookies, candies, and soda.  Drink enough fluid to keep your urine clear or pale yellow. General instructions  Exercise regularly or as told by your health care provider.  Go to the restroom when you have the urge to go. Do not hold it in.  Take over-the-counter and prescription medicines only as told by your health care provider. These include any fiber supplements.  Practice pelvic floor retraining exercises, such as deep breathing while relaxing the lower abdomen and pelvic floor relaxation during bowel movements.  Watch your condition for any changes.  Keep all follow-up visits as told by your health care provider. This is important. Contact a health care provider if:  You have pain that gets worse.  You have a fever.  You do  not have a bowel movement after 4 days.  You vomit.  You are not hungry.  You lose weight.  You are bleeding from the anus.  You have thin, pencil-like stools. Get help right away if:  You have a fever and your symptoms suddenly get worse.  You leak stool or have blood in your stool.  Your abdomen is bloated.  You have severe pain in your abdomen.  You feel dizzy or you faint. This information is not intended to replace advice given to you by your health care provider. Make sure you discuss any questions you have with your health care provider. Document Revised: 08/06/2017 Document Reviewed: 02/12/2016 Elsevier Patient Education  2020 [employer].

## 2020-01-23 NOTE — Progress Notes (Signed)
Subjective:    Patient ID: Dominique Powell, female    DOB: [birth year], 49 y.o.   MRN: [id number]  HPI Chief Complaint  Patient presents with  . cpe    cpe, fasting labs done in february by obgyn,    She is here for a complete physical exam.  Other providers: OB/GYN- OB/GYN Retina specialist  Optometrist  Dermatologist-dermatology specialist   I recently saw her for constipation issues.  Reports since then she has been drinking more water, exercising 3 times a week and started a stool softener.  No more constipation concerns.   Lupron injections for 7 years for endometriosis   Social history: Lives with husband and 34 year old son, works as   Denies smoking or drug use.  Drinks alcohol socially. Diet: fairly healthy  Excerise: 3 x per week   Immunizations: Covid vaccines completed.  Reports being up-to-date on Tdap.  [employer] employee.  Health maintenance:  Mammogram: 12/2019 Colonoscopy: never  Last Gynecological Exam: 10/2019 Last Dental Exam: 12/2019 Last Eye Exam: recent   Wears seatbelt always, uses sunscreen, smoke detectors in home and functioning, does not text while driving and feels safe in home environment.   Reviewed allergies, medications, past medical, surgical, family, and social history.   Review of Systems Review of Systems Constitutional: -fever, -chills, -sweats, -unexpected weight change,-fatigue ENT: -runny nose, -ear pain, -sore throat Cardiology:  -chest pain, -palpitations, -edema Respiratory: -cough, -shortness of breath, -wheezing Gastroenterology: -abdominal pain, -nausea, -vomiting, -diarrhea, -constipation  Hematology: -bleeding or bruising problems Musculoskeletal: -arthralgias, -myalgias, -joint swelling, -back pain Ophthalmology: -vision changes Urology: -dysuria, -difficulty urinating, -hematuria, -urinary frequency, -urgency Neurology: -headache, -weakness, -tingling, -numbness       Objective:   Physical  Exam BP 120/70   Pulse 78   Ht 5\' 5"  (1.651 m)   Wt 153 lb (69.4 kg)   BMI 25.46 kg/m   General Appearance:    Alert, cooperative, no distress, appears stated age  Head:    Normocephalic, without obvious abnormality, atraumatic  Eyes:    PERRL, conjunctiva/corneas clear, EOM's intact   Ears:    Normal TM's and external ear canals  Nose:   Mask in place   Throat:   Mask in place   Neck:   Supple, no lymphadenopathy;  thyroid:  no   enlargement/tenderness/nodules; no JVD  Back:    Spine nontender, no curvature, ROM normal, no CVA     tenderness  Lungs:     Clear to auscultation bilaterally without wheezes, rales or     ronchi; respirations unlabored  Chest Wall:    No tenderness or deformity   Heart:    Regular rate and rhythm, S1 and S2 normal, no murmur, rub   or gallop  Breast Exam:    OB/GYN  Abdomen:     Soft, non-tender, nondistended, normoactive bowel sounds,    no masses, no hepatosplenomegaly  Genitalia:    OB/GYN     Extremities:   No clubbing, cyanosis or edema  Pulses:   2+ and symmetric all extremities  Skin:   Skin color, texture, turgor normal, no rashes or lesions  Lymph nodes:   Cervical, supraclavicular, and axillary nodes normal  Neurologic:   CNII-XII intact, normal strength, sensation and gait; reflexes 2+ and symmetric throughout          Psych:   Normal mood, affect, hygiene and grooming.         Assessment & Plan:  Routine general medical examination at a  health care facility  Pure hypercholesterolemia  Screen for colon cancer - Plan: Ambulatory referral to Gastroenterology  She is here today for CPE.  She is in her usual state of health.  No new concerns today. Recent labs done with her OB/GYN and reviewed. She appears to be taking good care of herself and encouraged continuing with healthy diet and exercise.  Discussed elevated LDL with a very good HDL. Preventive health care reviewed.  Up-to-date on mammogram and Pap smear.  I will refer her to GI  for her first screening colonoscopy.  Discussed immunizations and she appears to be up-to-date.  Continue with regular dental cleanings, eye exams and skin exams with dermatology.  Follow-up in 1 year or sooner if needed.

## 2020-01-23 NOTE — Patient Instructions (Signed)

## 2020-01-24 ENCOUNTER — Other Ambulatory Visit: Payer: Self-pay

## 2020-01-24 ENCOUNTER — Ambulatory Visit (INDEPENDENT_AMBULATORY_CARE_PROVIDER_SITE_OTHER): Payer: 59 | Admitting: Family Medicine

## 2020-01-24 ENCOUNTER — Encounter: Payer: Self-pay | Admitting: Family Medicine

## 2020-01-24 VITALS — BP 120/70 | HR 78 | Ht 65.0 in | Wt 153.0 lb

## 2020-01-24 DIAGNOSIS — Z1211 Encounter for screening for malignant neoplasm of colon: Secondary | ICD-10-CM

## 2020-01-24 DIAGNOSIS — Z0000 Encounter for general adult medical examination without abnormal findings: Secondary | ICD-10-CM | POA: Diagnosis not present

## 2020-01-24 DIAGNOSIS — E7800 Pure hypercholesterolemia, unspecified: Secondary | ICD-10-CM | POA: Diagnosis not present

## 2020-02-20 ENCOUNTER — Encounter: Payer: Self-pay | Admitting: Family Medicine

## 2020-03-04 MED FILL — ESCITALOPRAM 20 MG TABLET: 20 | 90 days supply | Qty: 90 | Fill #2

## 2020-03-07 MED FILL — FLUTICASONE PROP 50 MCG SPR: 50 | 30 days supply | Qty: 16 | Fill #0

## 2020-03-18 MED FILL — FLUTICASONE PROP 50 MCG SPR: 50 | 30 days supply | Qty: 16 | Fill #0

## 2020-05-30 MED FILL — ESCITALOPRAM 20 MG TABLET: 20 | 90 days supply | Qty: 90 | Fill #3

## 2020-06-03 ENCOUNTER — Encounter: Payer: Self-pay | Admitting: Family Medicine

## 2020-06-03 ENCOUNTER — Encounter: Payer: Self-pay | Admitting: Gastroenterology

## 2020-06-11 DIAGNOSIS — H5213 Myopia, bilateral: Secondary | ICD-10-CM | POA: Diagnosis not present

## 2020-06-11 DIAGNOSIS — H52201 Unspecified astigmatism, right eye: Secondary | ICD-10-CM | POA: Diagnosis not present

## 2020-06-11 DIAGNOSIS — H524 Presbyopia: Secondary | ICD-10-CM | POA: Diagnosis not present

## 2020-06-12 MED FILL — VALACYCLOVIR HCL 500 MG TAB: 500 | 15 days supply | Qty: 30 | Fill #1

## 2020-06-14 DIAGNOSIS — H43811 Vitreous degeneration, right eye: Secondary | ICD-10-CM | POA: Diagnosis not present

## 2020-06-14 DIAGNOSIS — H2513 Age-related nuclear cataract, bilateral: Secondary | ICD-10-CM | POA: Diagnosis not present

## 2020-06-14 DIAGNOSIS — H33193 Other retinoschisis and retinal cysts, bilateral: Secondary | ICD-10-CM | POA: Diagnosis not present

## 2020-07-15 ENCOUNTER — Other Ambulatory Visit: Payer: Self-pay

## 2020-07-15 ENCOUNTER — Ambulatory Visit (AMBULATORY_SURGERY_CENTER): Payer: Self-pay | Admitting: *Deleted

## 2020-07-15 ENCOUNTER — Encounter: Payer: Self-pay | Admitting: Gastroenterology

## 2020-07-15 ENCOUNTER — Other Ambulatory Visit: Payer: Self-pay | Admitting: Gastroenterology

## 2020-07-15 VITALS — Ht 64.0 in | Wt 158.0 lb

## 2020-07-15 DIAGNOSIS — Z1211 Encounter for screening for malignant neoplasm of colon: Secondary | ICD-10-CM

## 2020-07-15 MED ORDER — SUPREP BOWEL PREP KIT 17.5-3.13-1.6 GM/177ML PO SOLN: 1.0000 | Freq: Once | ORAL | 0 refills | Status: DC

## 2020-07-15 MED FILL — SUPREP BOWEL PREP KIT: 17.5-3.13-1 | 1 days supply | Qty: 354 | Fill #0

## 2020-07-15 NOTE — Progress Notes (Signed)
No egg or soy allergy known to patient  No issues with past sedation with any surgeries or procedures no intubation problems in the past  No FH of Malignant Hyperthermia No diet pills per patient No home 02 use per patient  No blood thinners per patient  Pt denies issues with constipation  No A fib or A flutter  EMMI video to pt or via MyChart  COVID 19 guidelines implemented in [facility] today with Pt and RN   cov vax x 2    Due to the COVID-19 pandemic we are asking patients to follow these guidelines. Please only bring one care partner. Please be aware that your care partner may wait in the car in the parking lot or if they feel like they will be too hot to wait in the car, they may wait in the lobby on the 4th floor. All care partners are required to wear a mask the entire time (we do not have any that we can provide them), they need to practice social distancing, and we will do a Covid check for all patient's and care partners when you arrive. Also we will check their temperature and your temperature. If the care partner waits in their car they need to stay in the parking lot the entire time and we will call them on their cell phone when the patient is ready for discharge so they can bring the car to the front of the building. Also all patient's will need to wear a mask into building.  

## 2020-07-25 ENCOUNTER — Other Ambulatory Visit: Payer: Self-pay

## 2020-07-25 ENCOUNTER — Encounter: Payer: Self-pay | Admitting: Gastroenterology

## 2020-07-25 ENCOUNTER — Ambulatory Visit (AMBULATORY_SURGERY_CENTER): Payer: 59 | Admitting: Gastroenterology

## 2020-07-25 VITALS — BP 121/70 | HR 67 | Temp 96.0°F | Resp 15 | Ht 64.0 in | Wt 158.0 lb

## 2020-07-25 DIAGNOSIS — Z1211 Encounter for screening for malignant neoplasm of colon: Secondary | ICD-10-CM

## 2020-07-25 DIAGNOSIS — K635 Polyp of colon: Secondary | ICD-10-CM | POA: Diagnosis not present

## 2020-07-25 DIAGNOSIS — D123 Benign neoplasm of transverse colon: Secondary | ICD-10-CM

## 2020-07-25 MED ORDER — SODIUM CHLORIDE 0.9 % IV SOLN: 500.0000 mL | INTRAVENOUS | Status: DC

## 2020-07-25 NOTE — Progress Notes (Signed)
pt tolerated well. VSS. awake and to recovery. Report given to RN.  

## 2020-07-25 NOTE — Patient Instructions (Signed)
Handouts provided on hemorrhoids and hemorrhoid banding.   YOU HAD AN ENDOSCOPIC PROCEDURE TODAY AT [facility]:   Refer to the procedure report that was given to you for any specific questions about what was found during the examination.  If the procedure report does not answer your questions, please call your gastroenterologist to clarify.  If you requested that your care partner not be given the details of your procedure findings, then the procedure report has been included in a sealed envelope for you to review at your convenience later.  YOU SHOULD EXPECT: Some feelings of bloating in the abdomen. Passage of more gas than usual.  Walking can help get rid of the air that was put into your GI tract during the procedure and reduce the bloating. If you had a lower endoscopy (such as a colonoscopy or flexible sigmoidoscopy) you may notice spotting of blood in your stool or on the toilet paper. If you underwent a bowel prep for your procedure, you may not have a normal bowel movement for a few days.  Please Note:  You might notice some irritation and congestion in your nose or some drainage.  This is from the oxygen used during your procedure.  There is no need for concern and it should clear up in a day or so.  SYMPTOMS TO REPORT IMMEDIATELY:   Following lower endoscopy (colonoscopy or flexible sigmoidoscopy):  Excessive amounts of blood in the stool  Significant tenderness or worsening of abdominal pains  Swelling of the abdomen that is new, acute  Fever of 100F or higher  For urgent or emergent issues, a gastroenterologist can be reached at any hour by calling [phone]. Do not use MyChart messaging for urgent concerns.    DIET:  We do recommend a small meal at first, but then you may proceed to your regular diet.  Drink plenty of fluids but you should avoid alcoholic beverages for 24 hours.  ACTIVITY:  You should plan to take it easy for the rest of today and you  should NOT DRIVE or use heavy machinery until tomorrow (because of the sedation medicines used during the test).    FOLLOW UP: Our staff will call the number listed on your records 48-72 hours following your procedure to check on you and address any questions or concerns that you may have regarding the information given to you following your procedure. If we do not reach you, we will leave a message.  We will attempt to reach you two times.  During this call, we will ask if you have developed any symptoms of COVID 19. If you develop any symptoms (ie: fever, flu-like symptoms, shortness of breath, cough etc.) before then, please call [phone].  If you test positive for Covid 19 in the 2 weeks post procedure, please call and report this information to [place].    If any biopsies were taken you will be contacted by phone or by letter within the next 1-3 weeks.  Please call us at [phone] if you have not heard about the biopsies in 3 weeks.    SIGNATURES/CONFIDENTIALITY: You and/or your care partner have signed paperwork which will be entered into your electronic medical record.  These signatures attest to the fact that that the information above on your After Visit Summary has been reviewed and is understood.  Full responsibility of the confidentiality of this discharge information lies with you and/or your care-partner.

## 2020-07-25 NOTE — Progress Notes (Signed)
Called to room to assist during endoscopic procedure.  Patient ID and intended procedure confirmed with present staff. Received instructions for my participation in the procedure from the performing physician.  

## 2020-07-25 NOTE — Progress Notes (Signed)
Vs CW I have reviewed the patient's medical history in detail and updated the computerized patient record.   

## 2020-07-25 NOTE — Op Note (Signed)
[facility] Patient Name: Dominique Powell Procedure Date: 07/25/2020 9:02 AM MRN: [id number] Endoscopist: Mauri Pole , MD Age: 49 Referring MD:  Date of Birth: 1971-01-03 Gender: Female Account #: [id number] Procedure:                Colonoscopy Indications:              Screening for colorectal malignant neoplasm Medicines:                Monitored Anesthesia Care Procedure:                Pre-Anesthesia Assessment:                           - Prior to the procedure, a History and Physical                            was performed, and patient medications and                            allergies were reviewed. The patient's tolerance of                            previous anesthesia was also reviewed. The risks                            and benefits of the procedure and the sedation                            options and risks were discussed with the patient.                            All questions were answered, and informed consent                            was obtained. Prior Anticoagulants: The patient has                            taken no previous anticoagulant or antiplatelet                            agents. ASA Grade Assessment: I - A normal, healthy                            patient. After reviewing the risks and benefits,                            the patient was deemed in satisfactory condition to                            undergo the procedure.                           After obtaining informed consent, the colonoscope  was passed under direct vision. Throughout the                            procedure, the patient's blood pressure, pulse, and                            oxygen saturations were monitored continuously. The                            Colonoscope was introduced through the anus and                            advanced to the the cecum, identified by                            appendiceal orifice and  ileocecal valve. The                            colonoscopy was performed without difficulty. The                            patient tolerated the procedure well. The quality                            of the bowel preparation was good. The ileocecal                            valve, appendiceal orifice, and rectum were                            photographed. Scope In: 9:13:18 AM Scope Out: 9:28:41 AM Scope Withdrawal Time: 0 hours 10 minutes 37 seconds  Total Procedure Duration: 0 hours 15 minutes 23 seconds  Findings:                 The perianal and digital rectal examinations were                            normal.                           A 7 mm polyp was found in the transverse colon. The                            polyp was sessile. The polyp was removed with a                            cold snare. Resection and retrieval were complete.                           Non-bleeding internal hemorrhoids were found during                            retroflexion. The hemorrhoids were medium-sized.  The exam was otherwise without abnormality. Complications:            No immediate complications. Estimated Blood Loss:     Estimated blood loss was minimal. Impression:               - One 7 mm polyp in the transverse colon, removed                            with a cold snare. Resected and retrieved.                           - Non-bleeding internal hemorrhoids.                           - The examination was otherwise normal. Recommendation:           - Patient has a contact number available for                            emergencies. The signs and symptoms of potential                            delayed complications were discussed with the                            patient. Return to normal activities tomorrow.                            Written discharge instructions were provided to the                            patient.                           - Resume  previous diet.                           - Continue present medications.                           - Await pathology results.                           - Repeat colonoscopy in 5-10 years for surveillance                            based on pathology results. Mauri Pole, MD 07/25/2020 9:32:10 AM This report has been signed electronically.

## 2020-07-29 ENCOUNTER — Telehealth: Payer: Self-pay | Admitting: *Deleted

## 2020-07-29 ENCOUNTER — Telehealth: Payer: Self-pay

## 2020-07-29 NOTE — Telephone Encounter (Signed)
°  Follow up Call-  Call back number 07/25/2020  Post procedure Call Back phone  # [phone]  Permission to leave phone message Yes  Some recent data might be hidden     Patient questions:  Do you have a fever, pain , or abdominal swelling? No. Pain Score  0 *  Have you tolerated food without any problems? Yes.    Have you been able to return to your normal activities? Yes.    Do you have any questions about your discharge instructions: Diet   No. Medications  No. Follow up visit  No.  Do you have questions or concerns about your Care? No.  Actions: * If pain score is 4 or above: No action needed, pain <4.   1. Have you developed a fever since your procedure? no  2.   Have you had an respiratory symptoms (SOB or cough) since your procedure? no  3.   Have you tested positive for COVID 19 since your procedure no  4.   Have you had any family members/close contacts diagnosed with the COVID 19 since your procedure?  no   If yes to any of these questions please route to Joylene John, RN and Joella Prince, RN

## 2020-07-29 NOTE — Telephone Encounter (Signed)
First attempt follow up call to pt, lm on vm 

## 2020-08-13 ENCOUNTER — Encounter: Payer: Self-pay | Admitting: Gastroenterology

## 2020-09-05 ENCOUNTER — Other Ambulatory Visit (HOSPITAL_COMMUNITY): Payer: Self-pay | Admitting: Obstetrics and Gynecology

## 2020-09-05 DIAGNOSIS — Z01411 Encounter for gynecological examination (general) (routine) with abnormal findings: Secondary | ICD-10-CM | POA: Diagnosis not present

## 2020-09-05 DIAGNOSIS — Z01419 Encounter for gynecological examination (general) (routine) without abnormal findings: Secondary | ICD-10-CM | POA: Diagnosis not present

## 2020-09-05 DIAGNOSIS — Z124 Encounter for screening for malignant neoplasm of cervix: Secondary | ICD-10-CM | POA: Diagnosis not present

## 2020-09-05 DIAGNOSIS — Z1329 Encounter for screening for other suspected endocrine disorder: Secondary | ICD-10-CM | POA: Diagnosis not present

## 2020-09-05 DIAGNOSIS — Z13220 Encounter for screening for lipoid disorders: Secondary | ICD-10-CM | POA: Diagnosis not present

## 2020-09-05 DIAGNOSIS — Z1231 Encounter for screening mammogram for malignant neoplasm of breast: Secondary | ICD-10-CM | POA: Diagnosis not present

## 2020-09-05 DIAGNOSIS — Z6826 Body mass index (BMI) 26.0-26.9, adult: Secondary | ICD-10-CM | POA: Diagnosis not present

## 2020-09-05 DIAGNOSIS — Z113 Encounter for screening for infections with a predominantly sexual mode of transmission: Secondary | ICD-10-CM | POA: Diagnosis not present

## 2020-09-05 DIAGNOSIS — Z0000 Encounter for general adult medical examination without abnormal findings: Secondary | ICD-10-CM | POA: Diagnosis not present

## 2020-09-05 MED FILL — valACYclovir HCL 1 GM TABS: 1 | 15 days supply | Qty: 30 | Fill #0

## 2020-09-05 MED FILL — ESCITALOPRAM 20 MG TABLET: 20 | 90 days supply | Qty: 90 | Fill #0

## 2020-09-11 ENCOUNTER — Other Ambulatory Visit (HOSPITAL_COMMUNITY): Payer: Self-pay | Admitting: Certified Nurse Midwife

## 2020-09-11 DIAGNOSIS — Z713 Dietary counseling and surveillance: Secondary | ICD-10-CM | POA: Diagnosis not present

## 2020-09-11 DIAGNOSIS — Z6827 Body mass index (BMI) 27.0-27.9, adult: Secondary | ICD-10-CM | POA: Diagnosis not present

## 2020-09-12 MED FILL — PHENTERMINE 15 MG CAPSULE: 15 | 30 days supply | Qty: 30 | Fill #0

## 2020-10-10 ENCOUNTER — Other Ambulatory Visit (HOSPITAL_COMMUNITY): Payer: Self-pay | Admitting: Internal Medicine

## 2020-10-10 DIAGNOSIS — E669 Obesity, unspecified: Secondary | ICD-10-CM | POA: Diagnosis not present

## 2020-10-10 DIAGNOSIS — Z6826 Body mass index (BMI) 26.0-26.9, adult: Secondary | ICD-10-CM | POA: Diagnosis not present

## 2020-10-10 MED FILL — PHENTERMINE 37.5 MG TABLET: 37.5 | 30 days supply | Qty: 30 | Fill #0

## 2020-10-18 MED FILL — SHINGRIX 50 MCG SUS: 50 | 1 days supply | Qty: 1 | Fill #1

## 2020-11-07 DIAGNOSIS — E663 Overweight: Secondary | ICD-10-CM | POA: Diagnosis not present

## 2020-11-07 DIAGNOSIS — Z6827 Body mass index (BMI) 27.0-27.9, adult: Secondary | ICD-10-CM | POA: Diagnosis not present

## 2020-11-09 ENCOUNTER — Other Ambulatory Visit (HOSPITAL_COMMUNITY): Payer: Self-pay | Admitting: Certified Nurse Midwife

## 2020-11-11 MED FILL — PHENTERMINE 37.5 MG CAPSULE: 37.5 | 30 days supply | Qty: 30 | Fill #0

## 2020-11-21 ENCOUNTER — Other Ambulatory Visit: Payer: Self-pay | Admitting: Obstetrics and Gynecology

## 2020-11-21 DIAGNOSIS — Z1231 Encounter for screening mammogram for malignant neoplasm of breast: Secondary | ICD-10-CM

## 2020-12-09 ENCOUNTER — Other Ambulatory Visit (HOSPITAL_COMMUNITY): Payer: Self-pay

## 2020-12-09 IMAGING — MG MM DIGITAL DIAGNOSTIC UNILAT*R* W/ TOMO W/ CAD
4 series · 4 of 12 positions shown · non-contrast
Comparison: Previous exams including recent screening mammogram
dated 12/06/2019.

CLINICAL DATA: Patient returns today to evaluate a possible RIGHT
breast mass questioned on recent screening mammogram.

EXAM:
DIGITAL DIAGNOSTIC RIGHT MAMMOGRAM WITH CAD AND TOMO
ULTRASOUND RIGHT BREAST

[R ML synth-2D]
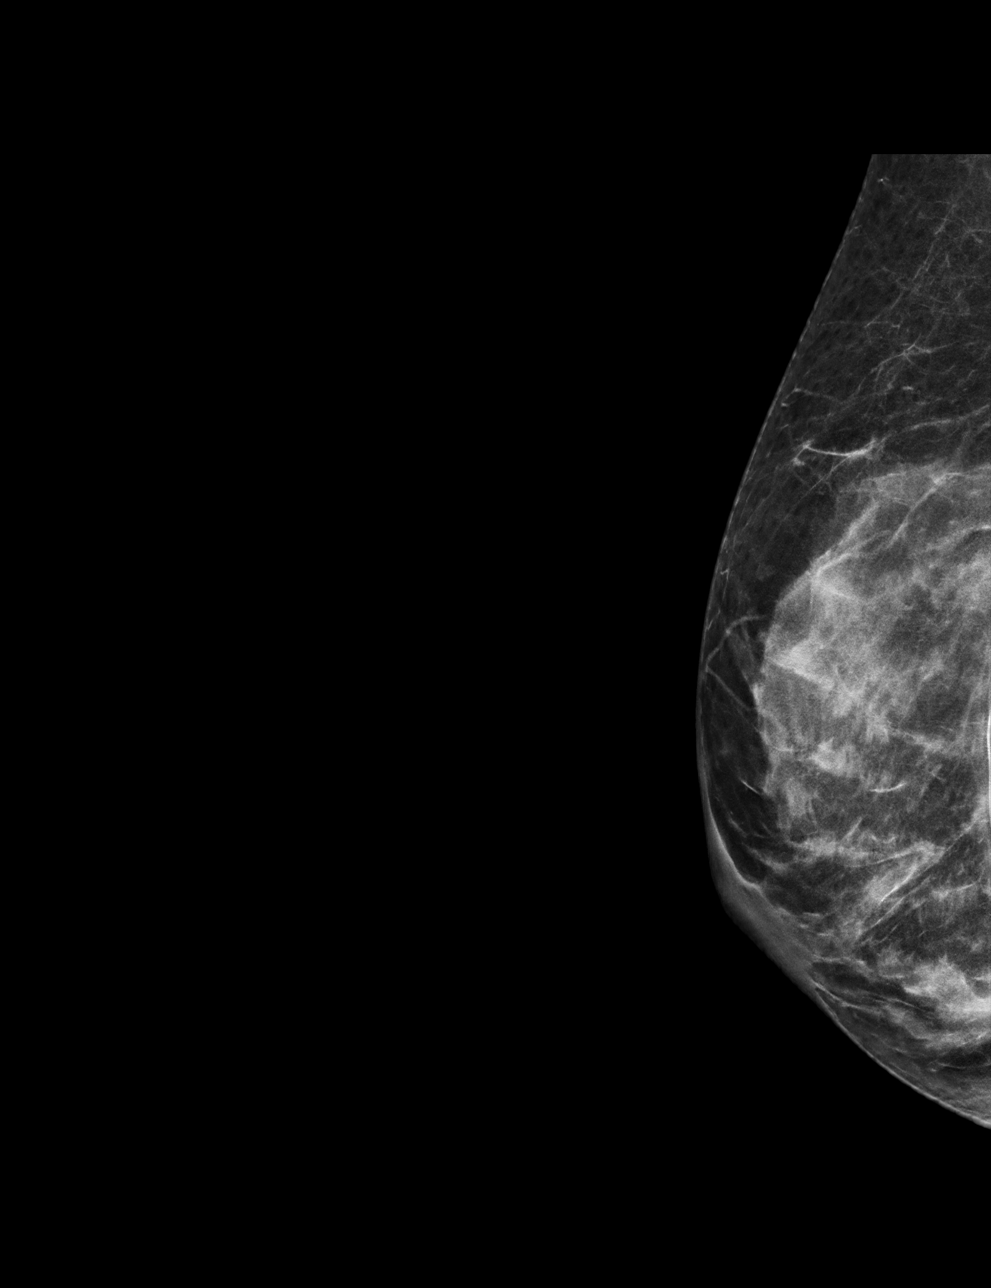

[R MLO synth-2D]
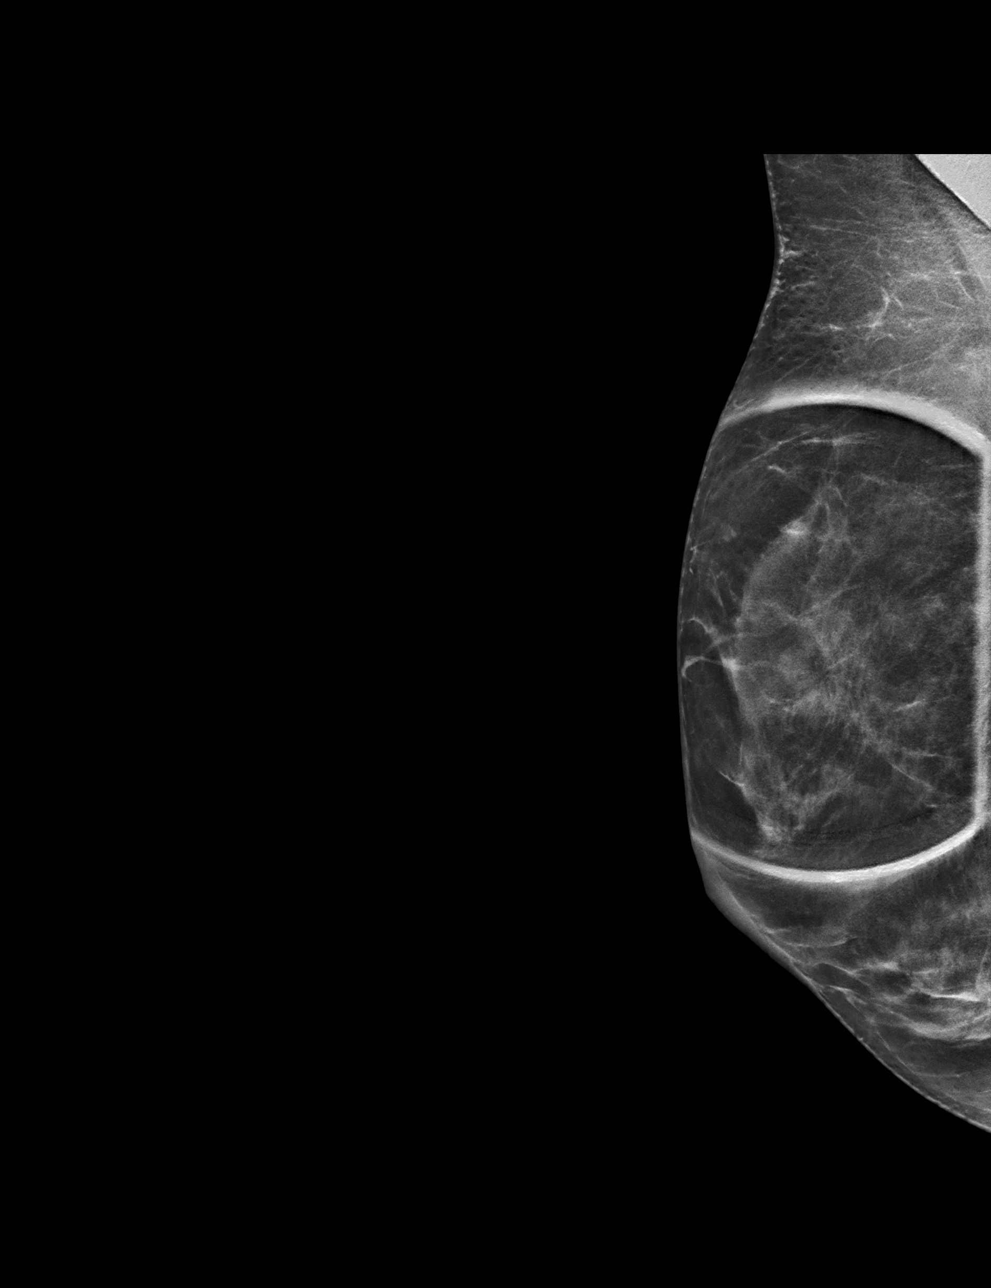

[R ML tomo · tomo slice 28/55.0]
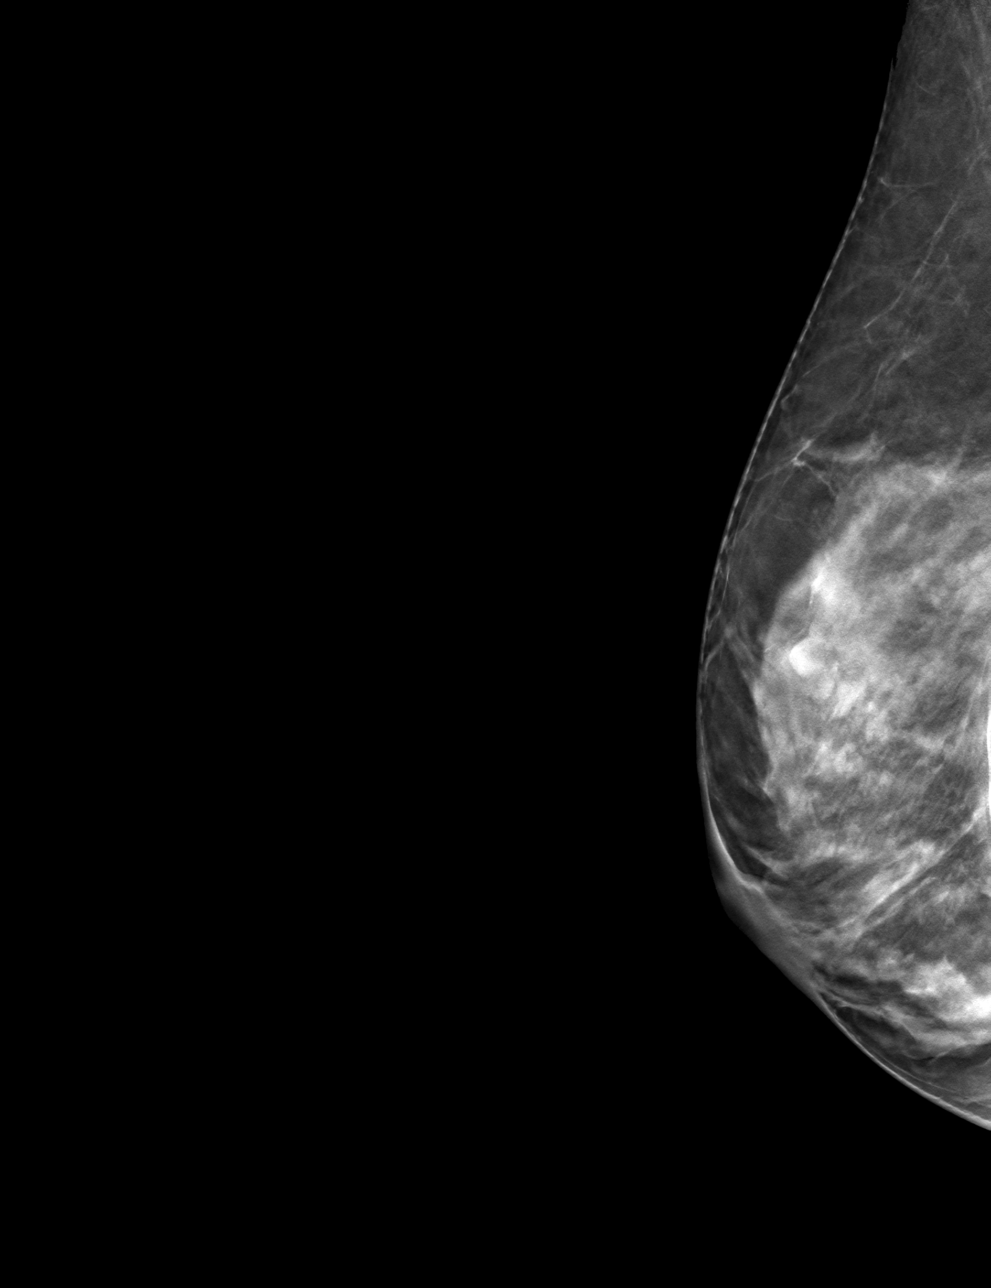

[R MLO tomo · tomo slice 25/48.0]
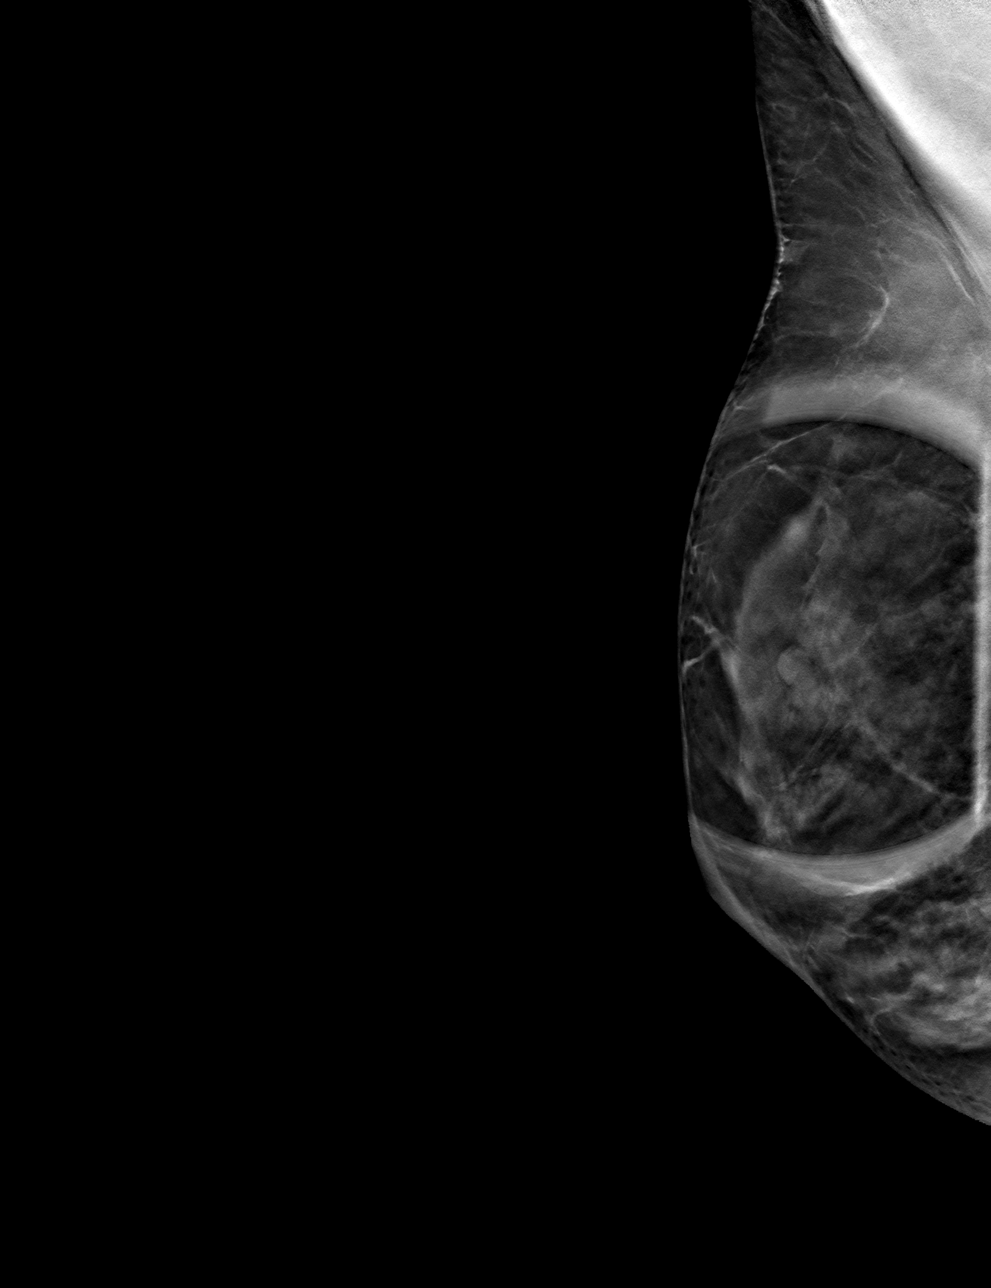

[4 of 12 positions shown; findings below may reference images not displayed]

ACR Breast Density Category c: The breast tissue is heterogeneously
dense, which may obscure small masses.
FINDINGS: An oval circumscribed mass is confirmed within the upper RIGHT
breast, upper inner quadrant based on tomosynthesis slice position,
measuring approximately 6 mm greatest dimension. Additional smaller
partially obscured masses are seen within the upper RIGHT breast at
posterior depth.

Mammographic images were processed with CAD.

Targeted ultrasound is performed, showing multiple benign cysts
within the upper RIGHT breast, 12-1 o'clock axes, largest measuring
8 mm, corresponding to the mammographic findings. No suspicious
solid or cystic mass is identified by ultrasound
IMPRESSION: No evidence of malignancy. Multiple benign cysts within the upper
RIGHT breast, corresponding to the mammographic findings.

Patient may return to routine annual bilateral screening mammogram
schedule.

RECOMMENDATION:
Screening mammogram in one year.(Code:IT-Q-GBN)

I have discussed the findings and recommendations with the patient.
If applicable, a reminder letter will be sent to the patient
regarding the next appointment.

BI-RADS CATEGORY  2: Benign.

## 2020-12-09 IMAGING — US US BREAST*R* LIMITED INC AXILLA
1 series · 13 of 13 positions shown · non-contrast
Comparison: Previous exams including recent screening mammogram
dated 12/06/2019.

CLINICAL DATA: Patient returns today to evaluate a possible RIGHT
breast mass questioned on recent screening mammogram.

EXAM:
DIGITAL DIAGNOSTIC RIGHT MAMMOGRAM WITH CAD AND TOMO
ULTRASOUND RIGHT BREAST

[Series 1: us breast*right* limited inc axilla · 0.06mm/px · 13 of 13 slices shown]
[im 1/13]
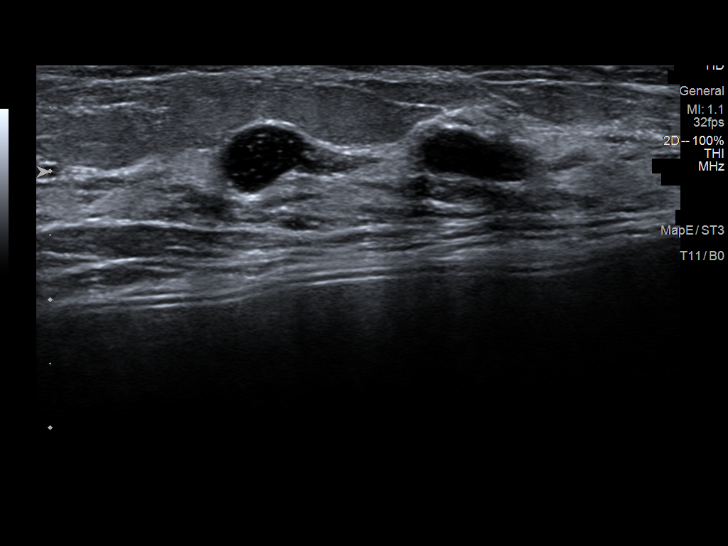
[im 2/13]
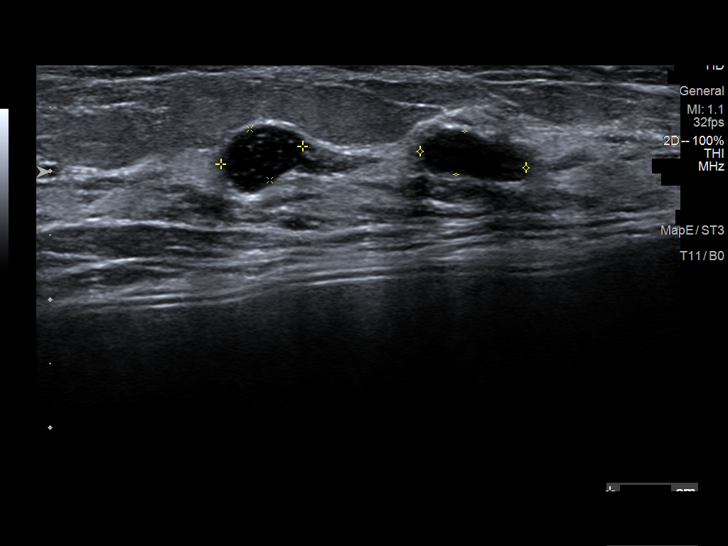
[im 3/13]
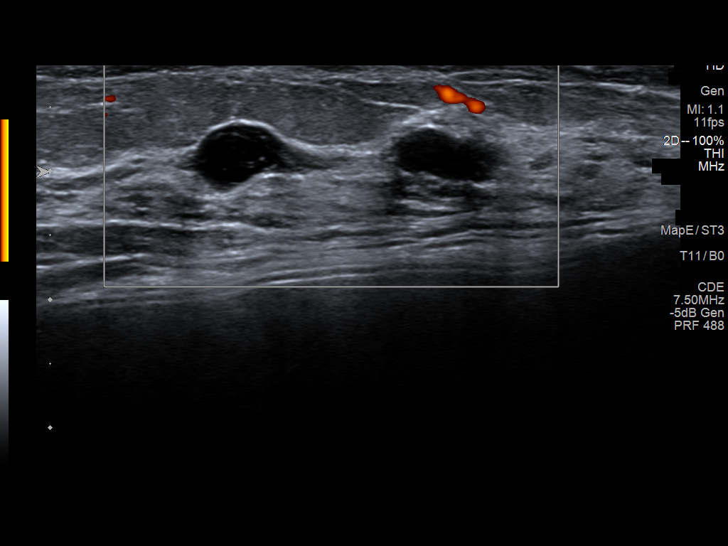
[im 4/13]
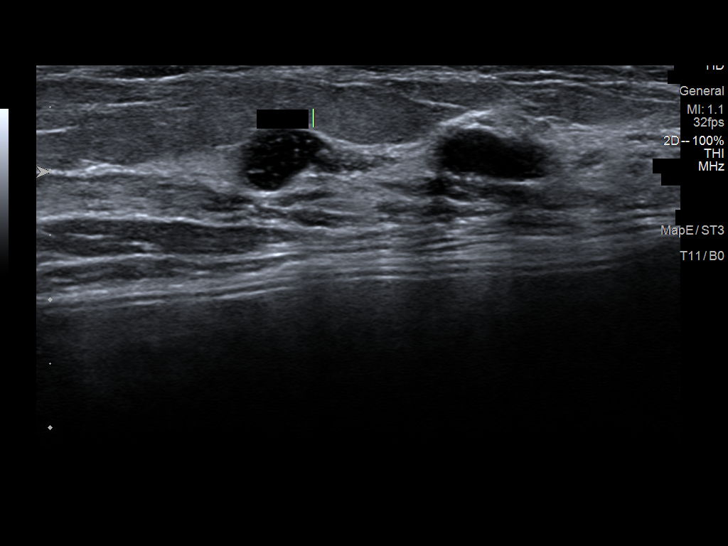
[im 5/13]
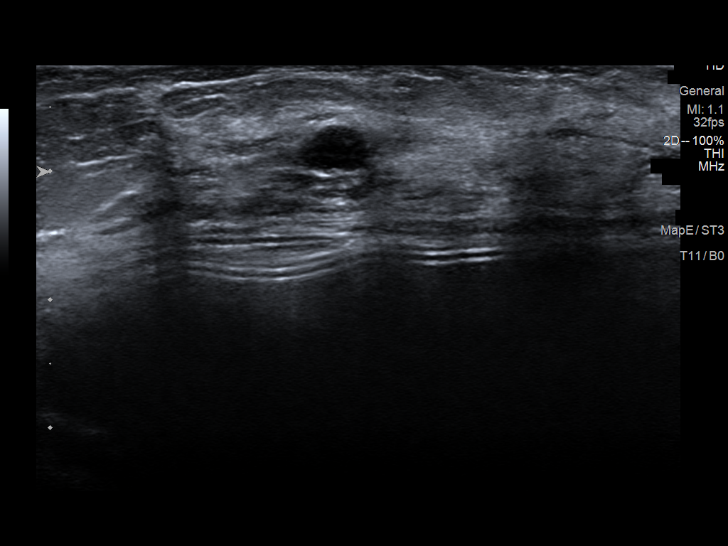
[im 6/13]
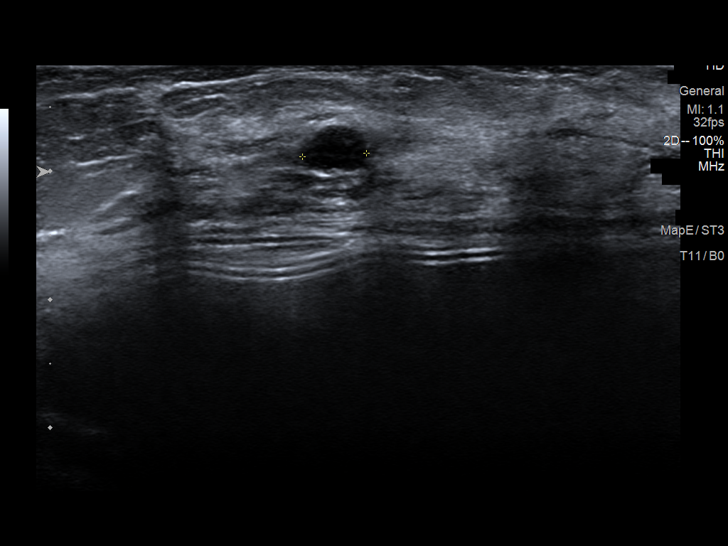
[im 7/13]
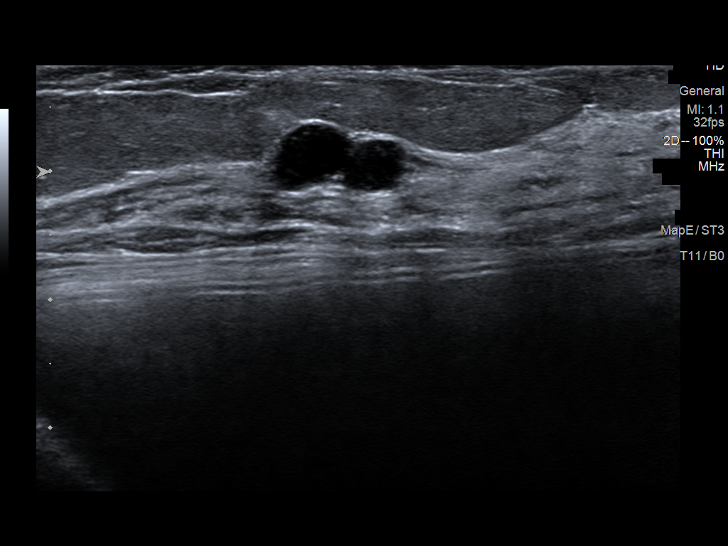
[im 8/13]
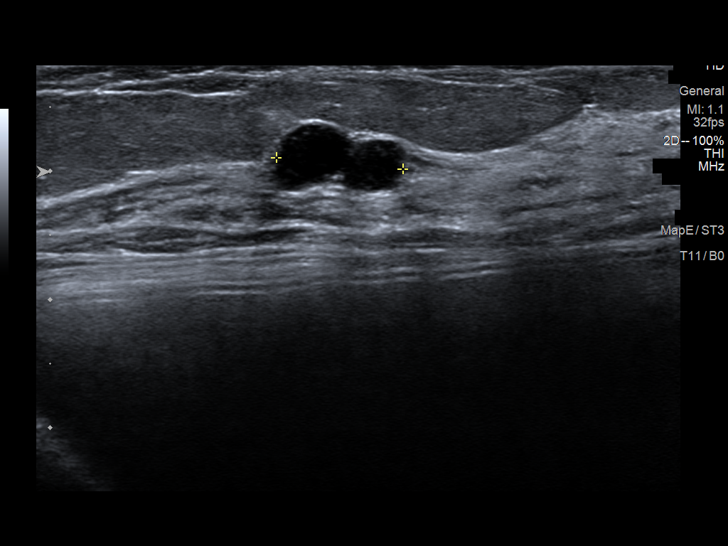
[im 9/13]
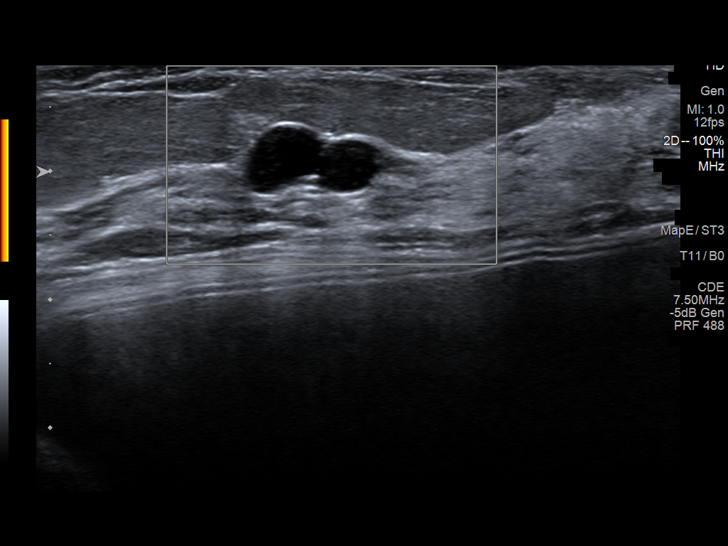
[im 10/13]
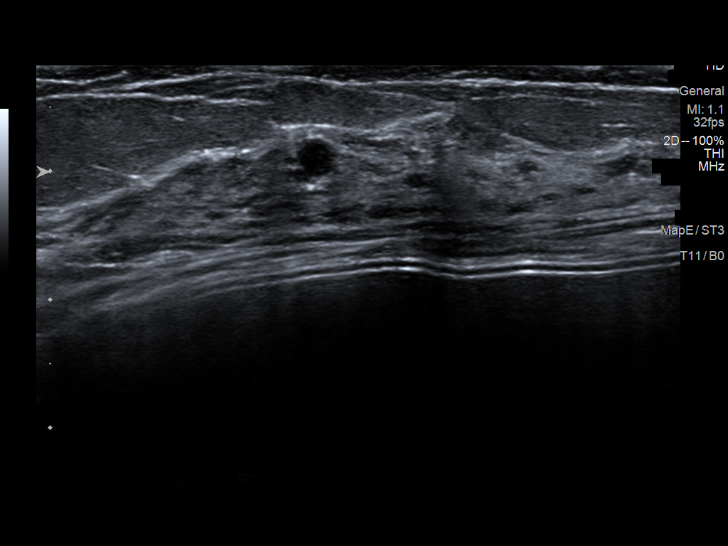
[im 11/13]
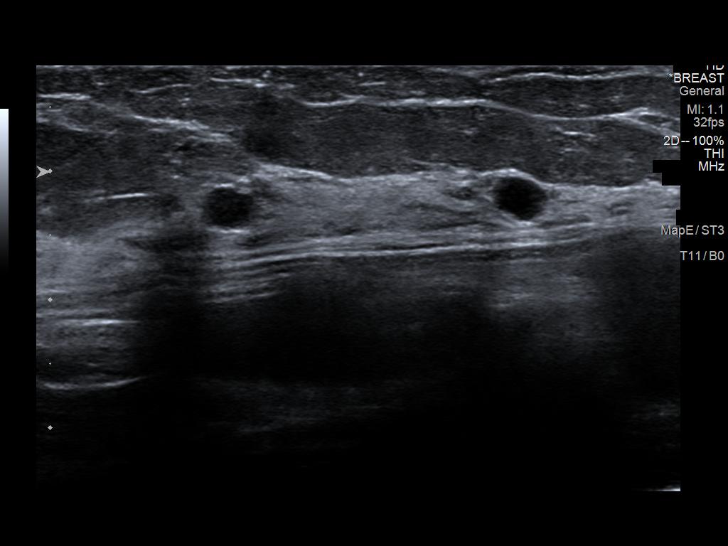
[im 12/13]
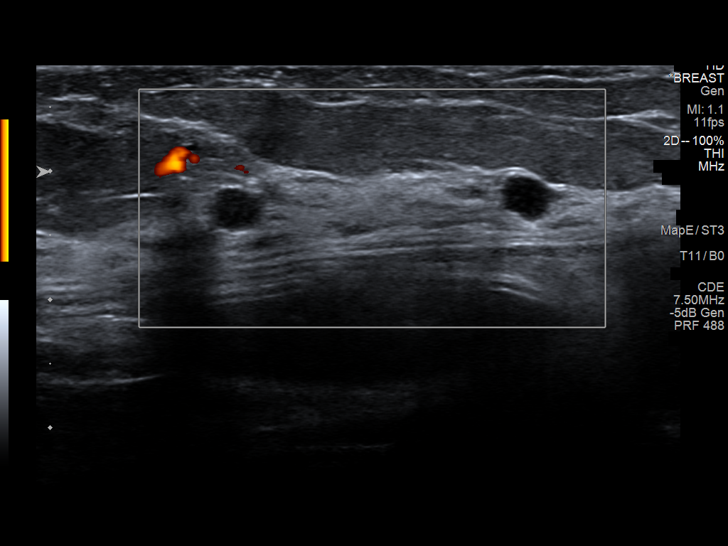
[im 13/13]
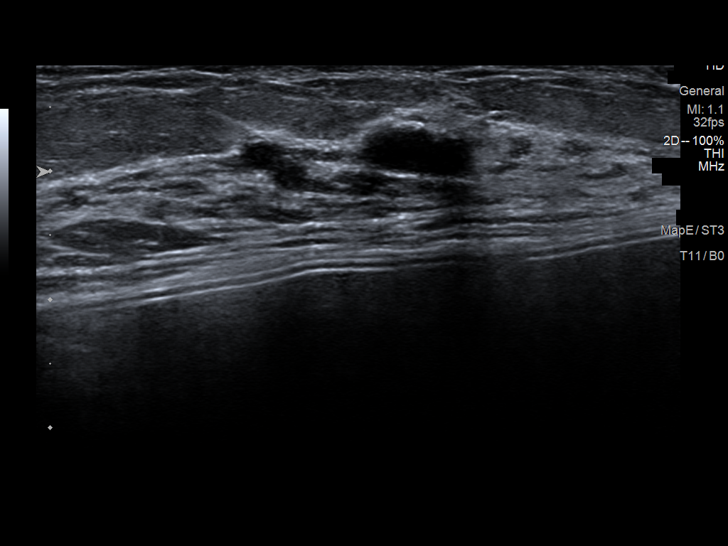

[13 of 13 positions shown; findings below may reference images not displayed]

ACR Breast Density Category c: The breast tissue is heterogeneously
dense, which may obscure small masses.
FINDINGS: An oval circumscribed mass is confirmed within the upper RIGHT
breast, upper inner quadrant based on tomosynthesis slice position,
measuring approximately 6 mm greatest dimension. Additional smaller
partially obscured masses are seen within the upper RIGHT breast at
posterior depth.

Mammographic images were processed with CAD.

Targeted ultrasound is performed, showing multiple benign cysts
within the upper RIGHT breast, 12-1 o'clock axes, largest measuring
8 mm, corresponding to the mammographic findings. No suspicious
solid or cystic mass is identified by ultrasound
IMPRESSION: No evidence of malignancy. Multiple benign cysts within the upper
RIGHT breast, corresponding to the mammographic findings.

Patient may return to routine annual bilateral screening mammogram
schedule.

RECOMMENDATION:
Screening mammogram in one year.(Code:IT-Q-GBN)

I have discussed the findings and recommendations with the patient.
If applicable, a reminder letter will be sent to the patient
regarding the next appointment.

BI-RADS CATEGORY  2: Benign.

## 2020-12-09 MED FILL — Phentermine HCl Cap 37.5 MG: ORAL | 30 days supply | Qty: 30 | Fill #0 | Status: AC

## 2020-12-09 MED FILL — Escitalopram Oxalate Tab 20 MG (Base Equiv): ORAL | 90 days supply | Qty: 90 | Fill #0 | Status: AC

## 2020-12-17 ENCOUNTER — Ambulatory Visit
Admission: RE | Admit: 2020-12-17 | Discharge: 2020-12-17 | Disposition: A | Discharge status: Home / Self Care | Payer: 59 | Source: Ambulatory Visit

## 2020-12-17 ENCOUNTER — Other Ambulatory Visit: Payer: Self-pay | Admitting: Obstetrics and Gynecology

## 2020-12-17 ENCOUNTER — Other Ambulatory Visit: Payer: Self-pay

## 2020-12-17 DIAGNOSIS — Z1231 Encounter for screening mammogram for malignant neoplasm of breast: Secondary | ICD-10-CM

## 2020-12-26 ENCOUNTER — Other Ambulatory Visit (HOSPITAL_COMMUNITY): Payer: Self-pay

## 2020-12-27 ENCOUNTER — Other Ambulatory Visit (HOSPITAL_COMMUNITY): Payer: Self-pay

## 2021-01-10 ENCOUNTER — Other Ambulatory Visit (HOSPITAL_COMMUNITY): Payer: Self-pay

## 2021-01-10 MED FILL — Phentermine HCl Cap 37.5 MG: ORAL | 30 days supply | Qty: 30 | Fill #1 | Status: AC

## 2021-01-13 ENCOUNTER — Other Ambulatory Visit (HOSPITAL_COMMUNITY): Payer: Self-pay

## 2021-01-13 MED FILL — Zoster Vac Recombinant Adjuvanted for IM Inj 50 MCG/0.5ML: INTRAMUSCULAR | 1 days supply | Qty: 1 | Fill #0 | Status: CN

## 2021-01-22 ENCOUNTER — Other Ambulatory Visit (HOSPITAL_COMMUNITY): Payer: Self-pay

## 2021-02-12 ENCOUNTER — Other Ambulatory Visit (HOSPITAL_COMMUNITY): Payer: Self-pay

## 2021-02-12 MED FILL — Phentermine HCl Cap 37.5 MG: ORAL | 30 days supply | Qty: 30 | Fill #2 | Status: CN

## 2021-02-13 ENCOUNTER — Other Ambulatory Visit (HOSPITAL_COMMUNITY): Payer: Self-pay

## 2021-02-13 DIAGNOSIS — Z7189 Other specified counseling: Secondary | ICD-10-CM | POA: Diagnosis not present

## 2021-02-13 DIAGNOSIS — Z6824 Body mass index (BMI) 24.0-24.9, adult: Secondary | ICD-10-CM | POA: Diagnosis not present

## 2021-02-13 DIAGNOSIS — Z713 Dietary counseling and surveillance: Secondary | ICD-10-CM | POA: Diagnosis not present

## 2021-02-13 MED ORDER — PHENTERMINE HCL 37.5 MG PO TABS
37.5000 mg | ORAL_TABLET | Freq: Every day | ORAL | 2 refills | Status: DC
  Filled 2021-02-13: qty 30, 30d supply, fill #0
  Filled 2021-04-16: qty 10, 10d supply, fill #1
  Filled 2021-04-16: qty 20, 20d supply, fill #1
  Filled 2021-05-15: qty 30, 30d supply, fill #2

## 2021-02-20 ENCOUNTER — Other Ambulatory Visit (HOSPITAL_COMMUNITY): Payer: Self-pay

## 2021-02-26 ENCOUNTER — Other Ambulatory Visit (HOSPITAL_COMMUNITY): Payer: Self-pay

## 2021-02-26 DIAGNOSIS — D2271 Melanocytic nevi of right lower limb, including hip: Secondary | ICD-10-CM | POA: Diagnosis not present

## 2021-02-26 DIAGNOSIS — D225 Melanocytic nevi of trunk: Secondary | ICD-10-CM | POA: Diagnosis not present

## 2021-02-26 DIAGNOSIS — D1801 Hemangioma of skin and subcutaneous tissue: Secondary | ICD-10-CM | POA: Diagnosis not present

## 2021-02-26 DIAGNOSIS — D2262 Melanocytic nevi of left upper limb, including shoulder: Secondary | ICD-10-CM | POA: Diagnosis not present

## 2021-02-26 DIAGNOSIS — L814 Other melanin hyperpigmentation: Secondary | ICD-10-CM | POA: Diagnosis not present

## 2021-02-26 DIAGNOSIS — L821 Other seborrheic keratosis: Secondary | ICD-10-CM | POA: Diagnosis not present

## 2021-02-26 MED ORDER — HYDROQUINONE 4 % EX CREA
1.0000 "application " | TOPICAL_CREAM | Freq: Two times a day (BID) | CUTANEOUS | 0 refills | Status: DC
  Filled 2021-02-26: qty 28.35, 20d supply, fill #0

## 2021-02-26 MED FILL — Zoster Vac Recombinant Adjuvanted for IM Inj 50 MCG/0.5ML: INTRAMUSCULAR | 1 days supply | Qty: 0.5 | Fill #0 | Status: CN

## 2021-03-03 ENCOUNTER — Other Ambulatory Visit (HOSPITAL_COMMUNITY): Payer: Self-pay

## 2021-03-03 MED FILL — Phentermine HCl Cap 37.5 MG: ORAL | 30 days supply | Qty: 30 | Fill #2 | Status: CN

## 2021-03-03 MED FILL — Escitalopram Oxalate Tab 20 MG (Base Equiv): ORAL | 90 days supply | Qty: 90 | Fill #1 | Status: AC

## 2021-03-04 ENCOUNTER — Other Ambulatory Visit (HOSPITAL_COMMUNITY): Payer: Self-pay

## 2021-03-11 ENCOUNTER — Other Ambulatory Visit (HOSPITAL_COMMUNITY): Payer: Self-pay

## 2021-03-11 MED FILL — Escitalopram Oxalate Tab 20 MG (Base Equiv): ORAL | 90 days supply | Qty: 90 | Fill #2 | Status: AC

## 2021-03-12 ENCOUNTER — Other Ambulatory Visit (HOSPITAL_COMMUNITY): Payer: Self-pay

## 2021-03-12 MED ORDER — HYDROQUINONE 4 % EX CREA
TOPICAL_CREAM | CUTANEOUS | 0 refills | Status: DC
  Filled 2021-03-12: qty 28.35, 25d supply, fill #0

## 2021-03-13 ENCOUNTER — Other Ambulatory Visit (HOSPITAL_COMMUNITY): Payer: Self-pay

## 2021-03-13 MED FILL — Phentermine HCl Cap 37.5 MG: ORAL | 30 days supply | Qty: 30 | Fill #2 | Status: AC

## 2021-03-14 ENCOUNTER — Other Ambulatory Visit (HOSPITAL_COMMUNITY): Payer: Self-pay

## 2021-04-16 ENCOUNTER — Other Ambulatory Visit (HOSPITAL_COMMUNITY): Payer: Self-pay

## 2021-05-14 ENCOUNTER — Other Ambulatory Visit (HOSPITAL_COMMUNITY): Payer: Self-pay

## 2021-05-14 ENCOUNTER — Other Ambulatory Visit: Payer: Self-pay

## 2021-05-14 MED ORDER — PHENTERMINE HCL 37.5 MG PO TABS
37.5000 mg | ORAL_TABLET | Freq: Every day | ORAL | 0 refills | Status: DC
  Filled 2021-05-14 – 2021-07-02 (×2): qty 30, 30d supply, fill #0

## 2021-05-15 ENCOUNTER — Other Ambulatory Visit (HOSPITAL_COMMUNITY): Payer: Self-pay

## 2021-06-05 ENCOUNTER — Other Ambulatory Visit (HOSPITAL_COMMUNITY): Payer: Self-pay

## 2021-06-05 MED FILL — Escitalopram Oxalate Tab 20 MG (Base Equiv): ORAL | 90 days supply | Qty: 90 | Fill #3 | Status: AC

## 2021-06-05 MED FILL — Zoster Vac Recombinant Adjuvanted for IM Inj 50 MCG/0.5ML: INTRAMUSCULAR | 1 days supply | Qty: 0.5 | Fill #0 | Status: CN

## 2021-06-11 DIAGNOSIS — H52223 Regular astigmatism, bilateral: Secondary | ICD-10-CM | POA: Diagnosis not present

## 2021-06-11 DIAGNOSIS — H33103 Unspecified retinoschisis, bilateral: Secondary | ICD-10-CM | POA: Diagnosis not present

## 2021-06-16 ENCOUNTER — Other Ambulatory Visit (HOSPITAL_COMMUNITY): Payer: Self-pay

## 2021-06-16 MED FILL — Zoster Vac Recombinant Adjuvanted for IM Inj 50 MCG/0.5ML: INTRAMUSCULAR | 1 days supply | Qty: 0.5 | Fill #0 | Status: CN

## 2021-06-24 ENCOUNTER — Other Ambulatory Visit (HOSPITAL_COMMUNITY): Payer: Self-pay

## 2021-07-02 ENCOUNTER — Other Ambulatory Visit (HOSPITAL_COMMUNITY): Payer: Self-pay

## 2021-08-11 ENCOUNTER — Telehealth: Payer: 59 | Admitting: Emergency Medicine

## 2021-08-11 ENCOUNTER — Other Ambulatory Visit (HOSPITAL_COMMUNITY): Payer: Self-pay

## 2021-08-11 DIAGNOSIS — N3001 Acute cystitis with hematuria: Secondary | ICD-10-CM | POA: Diagnosis not present

## 2021-08-11 MED ORDER — CEPHALEXIN 500 MG PO CAPS
500.0000 mg | ORAL_CAPSULE | Freq: Two times a day (BID) | ORAL | 0 refills | Status: AC
  Filled 2021-08-11: qty 14, 7d supply, fill #0

## 2021-08-11 NOTE — Progress Notes (Signed)
E-Visit for Urinary Problems ? ?We are sorry that you are not feeling well.  Here is how we plan to help! ? ?Based on what you shared with me it looks like you most likely have a simple urinary tract infection. ? ?A UTI (Urinary Tract Infection) is a bacterial infection of the bladder. ? ?Most cases of urinary tract infections are simple to treat but a key part of your care is to encourage you to drink plenty of fluids and watch your symptoms carefully. ? ?I have prescribed Keflex 500 mg twice a day for 7 days.  Your symptoms should gradually improve. Call us if the burning in your urine worsens, you develop worsening fever, back pain or pelvic pain or if your symptoms do not resolve after completing the antibiotic. ? ?Urinary tract infections can be prevented by drinking plenty of water to keep your body hydrated.  Also be sure when you wipe, wipe from front to back and don't hold it in!  If possible, empty your bladder every 4 hours. ? ?HOME CARE ?Drink plenty of fluids ?Compete the full course of the antibiotics even if the symptoms resolve ?Remember, when you need to go?go. Holding in your urine can increase the likelihood of getting a UTI! ?GET HELP RIGHT AWAY IF: ?You cannot urinate ?You get a high fever ?Worsening back pain occurs ?You see blood in your urine ?You feel sick to your stomach or throw up ?You feel like you are going to pass out ? ?MAKE SURE YOU  ?Understand these instructions. ?Will watch your condition. ?Will get help right away if you are not doing well or get worse. ? ? ?Thank you for choosing an e-visit. ? ?Your e-visit answers were reviewed by a board certified advanced clinical practitioner to complete your personal care plan. Depending upon the condition, your plan could have included both over the counter or prescription medications. ? ?Please review your pharmacy choice. Make sure the pharmacy is open so you can pick up prescription now. If there is a problem, you may contact your  provider through MyChart messaging and have the prescription routed to another pharmacy.  Your safety is important to us. If you have drug allergies check your prescription carefully.  ? ?For the next 24 hours you can use MyChart to ask questions about today's visit, request a non-urgent call back, or ask for a work or school excuse. ?You will get an email in the next two days asking about your experience. I hope that your e-visit has been valuable and will speed your recovery. ? ?I have spent 5 minutes in review of e-visit questionnaire, review and updating patient chart, medical decision making and response to patient.  ? ?Victorino Fatzinger, PhD, FNP-BC ?  ?

## 2021-09-11 ENCOUNTER — Other Ambulatory Visit (HOSPITAL_COMMUNITY): Payer: Self-pay

## 2021-09-11 ENCOUNTER — Other Ambulatory Visit: Payer: Self-pay

## 2021-09-11 MED ORDER — ESCITALOPRAM OXALATE 20 MG PO TABS
20.0000 mg | ORAL_TABLET | Freq: Every day | ORAL | 0 refills | Status: DC
  Filled 2021-09-11: qty 30, 30d supply, fill #0

## 2021-10-07 ENCOUNTER — Other Ambulatory Visit (HOSPITAL_COMMUNITY): Payer: Self-pay

## 2021-10-07 DIAGNOSIS — R32 Unspecified urinary incontinence: Secondary | ICD-10-CM | POA: Diagnosis not present

## 2021-10-07 DIAGNOSIS — N951 Menopausal and female climacteric states: Secondary | ICD-10-CM | POA: Diagnosis not present

## 2021-10-07 DIAGNOSIS — Z113 Encounter for screening for infections with a predominantly sexual mode of transmission: Secondary | ICD-10-CM | POA: Diagnosis not present

## 2021-10-07 DIAGNOSIS — Z01411 Encounter for gynecological examination (general) (routine) with abnormal findings: Secondary | ICD-10-CM | POA: Diagnosis not present

## 2021-10-07 DIAGNOSIS — Z13220 Encounter for screening for lipoid disorders: Secondary | ICD-10-CM | POA: Diagnosis not present

## 2021-10-07 DIAGNOSIS — Z1329 Encounter for screening for other suspected endocrine disorder: Secondary | ICD-10-CM | POA: Diagnosis not present

## 2021-10-07 DIAGNOSIS — Z6823 Body mass index (BMI) 23.0-23.9, adult: Secondary | ICD-10-CM | POA: Diagnosis not present

## 2021-10-07 DIAGNOSIS — Z0000 Encounter for general adult medical examination without abnormal findings: Secondary | ICD-10-CM | POA: Diagnosis not present

## 2021-10-07 DIAGNOSIS — Z01419 Encounter for gynecological examination (general) (routine) without abnormal findings: Secondary | ICD-10-CM | POA: Diagnosis not present

## 2021-10-07 DIAGNOSIS — Z124 Encounter for screening for malignant neoplasm of cervix: Secondary | ICD-10-CM | POA: Diagnosis not present

## 2021-10-07 DIAGNOSIS — Z131 Encounter for screening for diabetes mellitus: Secondary | ICD-10-CM | POA: Diagnosis not present

## 2021-10-07 MED ORDER — ESTRADIOL 0.05 MG/24HR TD PTWK
0.0500 mg | MEDICATED_PATCH | TRANSDERMAL | 2 refills | Status: DC
  Filled 2021-10-07: qty 4, 28d supply, fill #0
  Filled 2021-11-06: qty 4, 28d supply, fill #1
  Filled 2021-12-02: qty 4, 28d supply, fill #2

## 2021-10-07 MED ORDER — VALACYCLOVIR HCL 1 G PO TABS
1000.0000 mg | ORAL_TABLET | Freq: Two times a day (BID) | ORAL | 6 refills | Status: DC | PRN
  Filled 2021-10-07: qty 6, 3d supply, fill #0
  Filled 2021-11-06: qty 6, 3d supply, fill #1
  Filled 2021-12-07: qty 6, 3d supply, fill #2
  Filled 2022-07-30: qty 6, 3d supply, fill #3
  Filled 2022-09-02: qty 6, 3d supply, fill #4

## 2021-10-07 MED ORDER — ESCITALOPRAM OXALATE 20 MG PO TABS
20.0000 mg | ORAL_TABLET | Freq: Every day | ORAL | 12 refills | Status: DC
  Filled 2021-10-07: qty 30, 30d supply, fill #0
  Filled 2021-11-11: qty 30, 30d supply, fill #1
  Filled 2021-12-07: qty 30, 30d supply, fill #2
  Filled 2022-01-06: qty 30, 30d supply, fill #3
  Filled 2022-02-10: qty 30, 30d supply, fill #4
  Filled 2022-03-16: qty 30, 30d supply, fill #5
  Filled 2022-04-10: qty 30, 30d supply, fill #6
  Filled 2022-05-15: qty 30, 30d supply, fill #7
  Filled 2022-06-12: qty 30, 30d supply, fill #8

## 2021-11-05 ENCOUNTER — Ambulatory Visit: Payer: 59 | Admitting: Physical Therapy

## 2021-11-06 ENCOUNTER — Other Ambulatory Visit: Payer: Self-pay

## 2021-11-06 ENCOUNTER — Other Ambulatory Visit (HOSPITAL_COMMUNITY): Payer: Self-pay

## 2021-11-06 ENCOUNTER — Ambulatory Visit: Payer: 59 | Attending: Obstetrics and Gynecology | Admitting: Physical Therapy

## 2021-11-06 DIAGNOSIS — R279 Unspecified lack of coordination: Secondary | ICD-10-CM | POA: Diagnosis not present

## 2021-11-06 DIAGNOSIS — R293 Abnormal posture: Secondary | ICD-10-CM | POA: Diagnosis not present

## 2021-11-06 DIAGNOSIS — M6281 Muscle weakness (generalized): Secondary | ICD-10-CM | POA: Diagnosis not present

## 2021-11-06 NOTE — Patient Instructions (Signed)
Moisturizers ?They are used in the vagina to hydrate the mucous membrane that make up the vaginal canal. ?Designed to keep a more normal acid balance (ph) ?Once placed in the vagina, it will last between two to three days.  ?Use 2-3 times per week at bedtime  ?Ingredients to avoid is glycerin and fragrance, can increase chance of infection ?Should not be used just before sex due to causing irritation ?Most are gels administered either in a tampon-shaped applicator or as a vaginal suppository. They are non-hormonal. ? ? ?Types of Moisturizers(internal use) ? ?Vitamin E vaginal suppositories- Whole foods, Amazon ?Moist Again ?Coconut oil- can break down condoms ?Julva- (Do no use if on Tamoxifen) amazon ?Yes moisturizer- amazon ?NeuEve Silk , NeuEve Silver for menopausal or over 65 (if have severe vaginal atrophy or cancer treatments use NeuEve Silk for  1 month than move to The Pepsi)- [employer], [place].com ?Olive and Bee intimate cream- www.oliveandbee.com.au ?Mae vaginal moisturizer- Amazon ?Aloe ? ? ? ?Creams to use externally on the Vulva area ?Desert Sara Lee (good for for cancer patients that had radiation to the area)- [place] or [employer].FlyingBasics.com.br ?V-magic cream - amazon ?Julva-amazon ?Vital "V Wild Yam salve ( help moisturize and help with thinning vulvar area, does have Beeswax ?[place] ?Desert Conseco ?Cleo by Damiva labial moisturizer ([place],  ?Coconut or olive oil ?aloe ? ? ?Things to avoid in the vaginal area ?Do not use things to irritate the vulvar area ?No lotions just specialized creams for the vulva area- Neogyn, V-magic, No soaps; can use Aveeno or Calendula cleanser if needed. Must be gentle ?No deodorants ?No douches ?Good to sleep without underwear to let the vaginal area to air out ?No scrubbing: spread the lips to let warm water rinse over labias and pat dry ? ? ? ?Lubrication ?Used for intercourse to reduce friction ?Avoid ones that  have glycerin, nonoxynol-9, petroleum, propylene glycol, chlorhexidine gluconate, warming gels, tingling gels, icing or cooling gel, scented ?Avoid parabens due to a preservative similar to female sex hormone ?May need to be reapplied once or several times during sexual activity ?Can be applied to both partners genitals prior to vaginal penetration to minimize friction or irritation ?Prevent irritation and mucosal tears that cause post coital pain and increased the risk of vaginal and urinary tract infections ?Oil-based lubricants cannot be used with condoms due to breaking them down.  Least likely to irritate vaginal tissue.  ?Plant based-lubes are safe ?Silicone-based lubrication are thicker and last long and used for post-menopausal women ? ?Vaginal Lubricators ?Here is a list of some suggested lubricators you can use for intercourse. Use the most hypoallergenic product.  You can place on you or your partner.  ?Slippery Stuff ( water based) ?Sylk or Sliquid Natural H2O ( good  if frequent UTI?s)- walmart, amazon ?Sliquid organics silk-(aloe and silicone based ) ?Blossom Organics (www.blossom-organics.com)- (aloe based ) ?Coconut oil, olive oil -not good with condoms  ?PJur Woman Nude- (water based) amazon ?Uberlube- ( silicon) Amazon ?Aloe Vera- Sprouts has an organic one ?Yes lubricant- (water based and has plant oil based similar to silicone) Amazon ?Wet Platinum-Silicone, Target, Walgreens ?Olive and Bee intimate cream-  www.oliveandbee.com.au ?[place] ?Wet stuff ?Erosense Sync- walmart, amazon ?Coconu- FailLinks.co.uk ? ?Things to avoid in lubricants are glycerin, warming gels, tingling gels, icing or cooling ? gels, and scented gels.  Also avoid Vaseline. ?KY jelly, Replens, and Astroglide contain chlorhexidine which kills good bacteria(lactobacilli) ? ?Things to avoid in  the vaginal area ?Do not use things to irritate the vulvar area ?No lotions- see below ?Soaps you  can use :Aveeno, Calendula, Good Clean  Love cleanser if needed. Must be gentle ?No deodorants ?No douches ?Good to sleep without underwear to let the vaginal area to air out ?No scrubbing: spread the lips to let warm water rinse over labias and pat dry ? ?Creams that can be used on the Vulva Area ?V magic-amazon, walmart ?[place] ?Julva- Amazon ?MoonMaid Botanical Pro-Meno Wild Yam Cream ?Coconut oil, olive oil ?Cleo by Damiva labial moisturizer -[place],  ?Desert Havest Releveum ( lidocaine) or Desert Conseco ?Yes Moisturizer ? ?   ?THE KNACK ? ?The Knack is a strategy you may use to help to reduce or prevent leakage or passing of urine, gas or feces during an activity that causes downward force on the pelvic floor muscles.   ? ?Activities that can cause downward pressure on the pelvic floor muscles include coughing, sneezing, laughing, bending, lifting, and transitioning from different body positions such as from laying down to sitting up and sitting to standing. ? ?To perform The Knack, consciously squeeze and lift your pelvic floor muscles to perform a strong, well-timed pelvic muscle contraction BEFORE AND DURING these activities above.  As your contraction gets more coordinated and your muscles get stronger, you will become more effective in controlling your experience of incontinence or gas passing during these activities.   ? ? ? ?

## 2021-11-06 NOTE — Therapy (Signed)
?OUTPATIENT PHYSICAL THERAPY FEMALE PELVIC EVALUATION ? ? ?Patient Name: Dominique Powell ?MRN: [id number] ?DOB:02-26-1971, 51 y.o., female ?Today's Date: 11/06/2021 ? ? PT End of Session - 11/06/21 1103   ? ? Visit Number 1   ? Date for PT Re-Evaluation 02/06/22   ? Authorization Type UMR save   ? PT Start Time 0940   pt arrival time  ? PT Stop Time 1015   ? PT Time Calculation (min) 35 min   ? Activity Tolerance Patient tolerated treatment well   ? Behavior During Therapy [facility] for tasks assessed/performed   ? ?  ?  ? ?  ? ? ?Past Medical History:  ?Diagnosis Date  ? Allergy   ? Anxiety and depression   ? Endometriosis   ? Hyperlipidemia   ? no meds   ? Infertility   ? Kidney stone   ? Retinoschisis   ? ?Past Surgical History:  ?Procedure Laterality Date  ? AUGMENTATION MAMMAPLASTY Bilateral   ? INTRAUTERINE DEVICE INSERTION  04/23/2017  ? PELVIC LAPAROSCOPY    ? Endometriosis  ? tongue growth removed     ? age 43   ? TYMPANOSTOMY TUBE PLACEMENT    ? x3  ? WISDOM TOOTH EXTRACTION    ? ?Patient Active Problem List  ? Diagnosis Date Noted  ? Pure hypercholesterolemia 12/29/2019  ? Constipation 12/29/2019  ? IUD (intrauterine device) in place 08/11/2017  ? Anxiety and depression 07/24/2015  ? Infertility   ? Kidney stone   ? Retinoschisis   ? Endometriosis 09/07/2001  ? Allergic rhinitis 09/07/1988  ? ? ?PCP: Harland Dingwall L, PA-C ? ?REFERRING PROVIDER: Langley Gauss A, DO ? ?REFERRING DIAG: N39.3 (ICD-10-CM) - Stress incontinence (female) (female) ? ?THERAPY DIAG:  ?Muscle weakness (generalized) ? ?Abnormal posture ? ?Unspecified lack of coordination ? ?ONSET DATE: about 10 years ago and has progressively worse and much worse in the past 6 months ? ?SUBJECTIVE:                                                                                                                                                                                          ? ?SUBJECTIVE STATEMENT: ?Pt reports she started having urinary leakage  about 10 years ago with stressors and becoming more frequent and now happens with work outs, being startled, intercourse (1x).  ?Fluid intake: Yes: 119 oz    ? ?Patient confirms identification and approves PT to assess pelvic floor and treatment Yes ? ?PERTINENT HISTORY:  ?Endometriosis  ?Sexual abuse: No ? ?PAIN:  ?Are you having pain? No ? ? ? ?BOWEL MOVEMENT ?Pain with bowel movement: No does have small  internal hemmroids  ?Type of bowel movement:Type (Bristol Stool Scale) 4, Frequency 1x per day, and Strain Yes (sometimes) ?Fully empty rectum: Yes: as long as she is regular ?Leakage: No ?Pads: No ?Fiber supplement: Yes: colace daily ? ?URINATION ?Pain with urination: No ?Fully empty bladder: No sometimes feels like there are some still there ?Stream: Strong ?Urgency: Yes:   ?Frequency: 2-3 hours (not at night) ?Leakage: Walking to the bathroom, Coughing, Sneezing, Laughing, Exercise, and Intercourse ?Pads: No but thinks she needs to sometimes ? ?INTERCOURSE ?Pain with intercourse:  not really but has a small bit with endo ?Ability to have vaginal penetration:  Yes:   ? ?Climax: Yes:   ?Marinoff Scale  ? ?PREGNANCY ?Vaginal deliveries 2 ?Tearing Yes: episiotomy with first and had tearing with second ? ?Currently pregnant No ? ?PROLAPSE ?None ? ?PRECAUTIONS: None ? ?WEIGHT BEARING RESTRICTIONS No ? ?FALLS:  ?Has patient fallen in last 6 months? No, Number of falls: 0 ? ?LIVING ENVIRONMENT: ?Lives with: lives with their family ?Lives in: House/apartment ? ?Has following equipment at home: None ? ?OCCUPATION: nurse ? ?PLOF: Independent ? ?PATIENT GOALS To have less leakage ? ? ?OBJECTIVE:  ? ? ? ?COGNITION: ? Overall cognitive status: Within functional limits for tasks assessed   ?  ?SENSATION: ? Light touch: Appears intact ? Proprioception: Appears intact ? ?MUSCLE LENGTH: ?Hamstrings and adductors limited by 25% bil  ? ?POSTURE:  ?Mild posterior pelvic tilt and rounded shoulders ? ?PALPATION: ?Internal Pelvic  Floor no TTP ? ?External Perineal Exam WFL, no TTP ? ?GENERAL mildly decreased tone throughout internal pelvic floor, no TTP at abdomen or external pelvis or spine ? ?LUMBARAROM/PROM ? ?Lumbar rotation and side bending limited by 25% ? ?  ? ?LE AROM/PROM: ? ?All WFL bil ? ?LE MMT: ? ?Bil hips 4/5 grossly ? ?PELVIC MMT: ?  ?MMT  ?11/06/2021  ?Vaginal 2/5; 3 reps; 3s  ?Internal Anal Sphincter   ?External Anal Sphincter   ?Puborectalis   ?Diastasis Recti   ?(Blank rows = not tested) ? ? ?TONE: ?Mildly decreased ? ?PROLAPSE: ?None ? ? ? ? ? ? ?TODAY'S TREATMENT  ?EVAL pt educated on HEP, PT cued pt on proper technique for pelvic floor contractions, relaxations, and decreased compensatory strategies to improve strengthening and carry over for home.  ? ? ?PATIENT EDUCATION:  ?Education details: pt educated on HEP, POC, exam findings and knack method ?Person educated: Patient ?Education method: Explanation, Demonstration, Tactile cues, Verbal cues, and Handouts ?Education comprehension: verbalized understanding and returned demonstration ? ? ?HOME EXERCISE PROGRAM: ?[id number] ? ?ASSESSMENT: ? ?CLINICAL IMPRESSION: ?Patient is a 51 y.o. female  who was seen today for physical therapy evaluation and treatment for stress incontinence. Pt found to have mildly decreased flexibility in spine and bil hips, no pain, and decreased strength, endurance, and coordination of pelvic floor during vaginal assessment. Pt consented to internal pelvic floor assessment this session and had no pain but did have mildly decreased tone throughout.  ? ?OBJECTIVE IMPAIRMENTS decreased activity tolerance, decreased coordination, decreased endurance, decreased mobility, decreased strength, increased fascial restrictions, improper body mechanics, and postural dysfunction.  ? ?ACTIVITY LIMITATIONS community activity and fitness, intercourse .  ? ?PERSONAL FACTORS Fitness and 1-2 comorbidities: x2 vaginal births with tearing and episiotomy  are also  affecting patient's functional outcome.  ? ? ?REHAB POTENTIAL: Good ? ?CLINICAL DECISION MAKING: Stable/uncomplicated ? ?EVALUATION COMPLEXITY: Low ? ? ?GOALS: ?Goals reviewed with patient? Yes ? ?SHORT TERM GOALS: ? ?Pt to be I with HEP. ? ?  Baseline:  ?Target date: 12/04/2021 ?Goal status: INITIAL ? ?2.  Pt to report improved time between bladder voids to at least 3 hours for improved QOL with decreased urinary frequency.   ?Baseline: 2-3 but not consistently 3 hours ?Target date: 12/04/2021 ?Goal status: INITIAL ? ?3.  Pt to demonstrate at least 3/5 pelvic floor strength for improved pelvic stability and decreased strain at pelvic floor/ decrease leakage.  ?Baseline: 2/5 ?Target date: 12/04/2021 ?Goal status: INITIAL ? ? ?LONG TERM GOALS: ? ?Pt to be I with advanced HEP. ?Baseline:  ?Target date: 02/06/2022 ?Goal status: INITIAL ? ?2.  Pt to report improved time between bladder voids to at least 4 hours for improved QOL with decreased urinary frequency.   ?Baseline: 2-3 ?Target date: 02/06/2022 ?Goal status: INITIAL ? ?3.  Pt to demonstrate at least 3/5 pelvic floor strength for improved pelvic stability and decreased strain at pelvic floor/ decrease leakage.  ?Baseline: 2/5 ?Target date: 02/06/2022 ?Goal status: INITIAL ? ?4.  Pt to report no more than one urinary leakage instance with stressor per month to improve QOL and decrease symptoms.  ?Baseline:  ?Target date: 02/06/2022 ?Goal status: INITIAL ? ?PLAN: ?PT FREQUENCY:  every other week ? ?PT DURATION:  10 sessions ? ?PLANNED INTERVENTIONS: Therapeutic exercises, Therapeutic activity, Neuromuscular re-education, Patient/Family education, Joint mobilization, Dry Needling, Cryotherapy, Moist heat, scar mobilization, Taping, and Manual therapy ? ?PLAN FOR NEXT SESSION: go over urge drill, voiding mechanics, pelvic strengthening with exercises ? ? ?Junie Panning, PT ?11/06/2021, 11:07 AM ? ?

## 2021-11-11 ENCOUNTER — Other Ambulatory Visit (HOSPITAL_COMMUNITY): Payer: Self-pay

## 2021-11-12 ENCOUNTER — Encounter: Payer: Self-pay | Admitting: Physical Therapy

## 2021-11-26 ENCOUNTER — Other Ambulatory Visit: Payer: Self-pay

## 2021-11-26 ENCOUNTER — Ambulatory Visit: Payer: 59 | Admitting: Physical Therapy

## 2021-11-26 DIAGNOSIS — M6281 Muscle weakness (generalized): Secondary | ICD-10-CM | POA: Diagnosis not present

## 2021-11-26 DIAGNOSIS — R279 Unspecified lack of coordination: Secondary | ICD-10-CM

## 2021-11-26 DIAGNOSIS — R293 Abnormal posture: Secondary | ICD-10-CM | POA: Diagnosis not present

## 2021-11-26 NOTE — Therapy (Signed)
?OUTPATIENT PHYSICAL THERAPY TREATMENT NOTE ? ? ?Patient Name: Dominique Powell ?MRN: [id number] ?DOB:09/11/1970, 51 y.o., female ?Today's Date: 11/26/2021 ? ?PCP: Girtha Rm, NP-C ?REFERRING PROVIDER: Girtha Rm, NP-C ? ? PT End of Session - 11/26/21 1703   ? ? Visit Number 2   ? Date for PT Re-Evaluation 02/06/22   ? Authorization Type UMR save   ? PT Start Time 1619   ? PT Stop Time 1702   ? PT Time Calculation (min) 43 min   ? Activity Tolerance Patient tolerated treatment well   ? Behavior During Therapy [facility] for tasks assessed/performed   ? ?  ?  ? ?  ? ? ?Past Medical History:  ?Diagnosis Date  ? Allergy   ? Anxiety and depression   ? Endometriosis   ? Hyperlipidemia   ? no meds   ? Infertility   ? Kidney stone   ? Retinoschisis   ? ?Past Surgical History:  ?Procedure Laterality Date  ? AUGMENTATION MAMMAPLASTY Bilateral   ? INTRAUTERINE DEVICE INSERTION  04/23/2017  ? PELVIC LAPAROSCOPY    ? Endometriosis  ? tongue growth removed     ? age 25   ? TYMPANOSTOMY TUBE PLACEMENT    ? x3  ? WISDOM TOOTH EXTRACTION    ? ?Patient Active Problem List  ? Diagnosis Date Noted  ? Pure hypercholesterolemia 12/29/2019  ? Constipation 12/29/2019  ? IUD (intrauterine device) in place 08/11/2017  ? Anxiety and depression 07/24/2015  ? Infertility   ? Kidney stone   ? Retinoschisis   ? Endometriosis 09/07/2001  ? Allergic rhinitis 09/07/1988  ? ? ?REFERRING DIAG: N39.3 (ICD-10-CM) - Stress incontinence (female) (female) ? ?THERAPY DIAG:  ?Muscle weakness (generalized) ? ?Unspecified lack of coordination ? ?PERTINENT HISTORY: Endometriosis  ?Sexual abuse: No ? ?PRECAUTIONS: none ? ?SUBJECTIVE: Pt reports she has done the HEP but not as much as she wanted. Pt has been doing more research on pelvic floor therapy and motivated to participate. ? ?PAIN:  ?Are you having pain? No ? ? ?OBJECTIVE:  ?  ?  ?  ?COGNITION: ?           Overall cognitive status: Within functional limits for tasks assessed              ?             ?SENSATION: ?           Light touch: Appears intact ?           Proprioception: Appears intact ?  ?MUSCLE LENGTH: ?Hamstrings and adductors limited by 25% bil  ?  ?POSTURE:  ?Mild posterior pelvic tilt and rounded shoulders ?  ?PALPATION: ?Internal Pelvic Floor no TTP ?  ?External Perineal Exam WFL, no TTP ?  ?GENERAL mildly decreased tone throughout internal pelvic floor, no TTP at abdomen or external pelvis or spine ?  ?LUMBARAROM/PROM ?  ?Lumbar rotation and side bending limited by 25% ?  ?  ?  ?LE AROM/PROM: ?  ?All WFL bil ?  ?LE MMT: ?  ?Bil hips 4/5 grossly ?  ?PELVIC MMT: ?  ?MMT   ?11/06/2021  ?Vaginal 2/5; 3 reps; 3s  ?Internal Anal Sphincter    ?External Anal Sphincter    ?Puborectalis    ?Diastasis Recti    ?(Blank rows = not tested) ?  ?  ?TONE: ?Mildly decreased ?  ?PROLAPSE: ?None ?  ?  ?  ?  ?  ?  ?TODAY'S TREATMENT  ? ?  11/26/2021: ?Educated on bladder irritants, urge drill, and pt had several questions about pelvic floor POC, and techniques for home feedback. All answered and pt denied additional questions ? 2x10 bridges with ball squeeze ?2x10 hip abduction in sidelying with ball press for coordinated deep core  ?2x10 ball press opp arm/hand ?2x10 squats ?Palloff green band x10 ?All exercises completed with cues for breathing mechanics and pelvic floor coordination ? ? ?EVAL pt educated on HEP, PT cued pt on proper technique for pelvic floor contractions, relaxations, and decreased compensatory strategies to improve strengthening and carry over for home.  ?  ?  ?PATIENT EDUCATION:  ?Education details: pt educated on urge drill and bladder irritants and bladder retraining ?Person educated: Patient ?Education method: Explanation, Demonstration, Tactile cues, Verbal cues, and Handouts ?Education comprehension: verbalized understanding and returned demonstration ?  ?  ?HOME EXERCISE PROGRAM: ?[id number] ?  ?ASSESSMENT: ?  ?CLINICAL IMPRESSION: ?Pt presents to clinic reporting she has not seen a huge  difference since eval yet but has attempted HEP a few times. Pt session focused on education of pelvic floor with functional mobility correlation and bladder retraining, then core/hip strengthening with pelvic floor and breathing coordination for decreased leakage. Pt would benefit from additional PT to further address deficits.     ?  ?OBJECTIVE IMPAIRMENTS decreased activity tolerance, decreased coordination, decreased endurance, decreased mobility, decreased strength, increased fascial restrictions, improper body mechanics, and postural dysfunction.  ?  ?ACTIVITY LIMITATIONS community activity and fitness, intercourse .  ?  ?PERSONAL FACTORS Fitness and 1-2 comorbidities: x2 vaginal births with tearing and episiotomy  are also affecting patient's functional outcome.  ?  ?  ?REHAB POTENTIAL: Good ?  ?CLINICAL DECISION MAKING: Stable/uncomplicated ?  ?EVALUATION COMPLEXITY: Low ?  ?  ?GOALS: ?Goals reviewed with patient? Yes ?  ?SHORT TERM GOALS: ?  ?Pt to be I with HEP. ?  ?Baseline:  ?Target date: 12/04/2021 ?Goal status: INITIAL ?  ?2.  Pt to report improved time between bladder voids to at least 3 hours for improved QOL with decreased urinary frequency.   ?Baseline: 2-3 but not consistently 3 hours ?Target date: 12/04/2021 ?Goal status: INITIAL ?  ?3.  Pt to demonstrate at least 3/5 pelvic floor strength for improved pelvic stability and decreased strain at pelvic floor/ decrease leakage.  ?Baseline: 2/5 ?Target date: 12/04/2021 ?Goal status: INITIAL ?  ?  ?LONG TERM GOALS: ?  ?Pt to be I with advanced HEP. ?Baseline:  ?Target date: 02/06/2022 ?Goal status: INITIAL ?  ?2.  Pt to report improved time between bladder voids to at least 4 hours for improved QOL with decreased urinary frequency.   ?Baseline: 2-3 ?Target date: 02/06/2022 ?Goal status: INITIAL ?  ?3.  Pt to demonstrate at least 4/5 pelvic floor strength for improved pelvic stability and decreased strain at pelvic floor/ decrease leakage.  ?Baseline:  2/5 ?Target date: 02/06/2022 ?Goal status: INITIAL ?  ?4.  Pt to report no more than one urinary leakage instance with stressor per month to improve QOL and decrease symptoms.  ?Baseline:  ?Target date: 02/06/2022 ?Goal status: INITIAL ?  ?PLAN: ?PT FREQUENCY:  every other week ?  ?PT DURATION:  10 sessions ?  ?PLANNED INTERVENTIONS: Therapeutic exercises, Therapeutic activity, Neuromuscular re-education, Patient/Family education, Joint mobilization, Dry Needling, Cryotherapy, Moist heat, scar mobilization, Taping, and Manual therapy ?  ?PLAN FOR NEXT SESSION: go over urge drill, voiding mechanics, pelvic strengthening with exercises ?  ? ? ?Stacy Gardner, PT, DPT ?03/22/235:04 PM  ? ?  ? ?

## 2021-11-26 NOTE — Patient Instructions (Signed)

## 2021-12-03 ENCOUNTER — Other Ambulatory Visit (HOSPITAL_COMMUNITY): Payer: Self-pay

## 2021-12-08 ENCOUNTER — Other Ambulatory Visit (HOSPITAL_COMMUNITY): Payer: Self-pay

## 2021-12-10 ENCOUNTER — Encounter: Payer: 59 | Admitting: Physical Therapy

## 2021-12-10 ENCOUNTER — Encounter: Payer: Self-pay | Admitting: Physical Therapy

## 2021-12-19 ENCOUNTER — Other Ambulatory Visit (HOSPITAL_COMMUNITY): Payer: Self-pay

## 2021-12-19 DIAGNOSIS — N951 Menopausal and female climacteric states: Secondary | ICD-10-CM | POA: Diagnosis not present

## 2021-12-19 MED ORDER — ESTRADIOL 0.05 MG/24HR TD PTWK
0.0500 mg | MEDICATED_PATCH | TRANSDERMAL | 9 refills | Status: DC
  Filled 2021-12-19: qty 4, 28d supply, fill #0
  Filled 2022-01-26: qty 4, 28d supply, fill #1
  Filled 2022-02-27: qty 4, 28d supply, fill #2
  Filled 2022-03-04 – 2022-03-16 (×2): qty 4, 28d supply, fill #3
  Filled 2022-04-10: qty 4, 28d supply, fill #4
  Filled 2022-05-24: qty 4, 28d supply, fill #5
  Filled 2022-06-21: qty 4, 28d supply, fill #6
  Filled 2022-07-12: qty 4, 28d supply, fill #7
  Filled 2022-08-17: qty 4, 28d supply, fill #8
  Filled 2022-09-02 – 2022-09-22 (×3): qty 4, 28d supply, fill #9

## 2021-12-23 ENCOUNTER — Other Ambulatory Visit: Payer: Self-pay | Admitting: Obstetrics and Gynecology

## 2021-12-23 ENCOUNTER — Ambulatory Visit: Payer: 59 | Attending: Obstetrics and Gynecology | Admitting: Physical Therapy

## 2021-12-23 DIAGNOSIS — M6281 Muscle weakness (generalized): Secondary | ICD-10-CM | POA: Insufficient documentation

## 2021-12-23 DIAGNOSIS — R293 Abnormal posture: Secondary | ICD-10-CM | POA: Insufficient documentation

## 2021-12-23 DIAGNOSIS — R279 Unspecified lack of coordination: Secondary | ICD-10-CM | POA: Diagnosis not present

## 2021-12-23 DIAGNOSIS — Z1231 Encounter for screening mammogram for malignant neoplasm of breast: Secondary | ICD-10-CM

## 2021-12-23 NOTE — Therapy (Addendum)
?OUTPATIENT PHYSICAL THERAPY TREATMENT NOTE ? ? ?Patient Name: Dominique Powell ?MRN: [id number] ?DOB:1971-03-03, 51 y.o., female ?Today's Date: 12/23/2021 ? ?PCP: Girtha Rm, NP-C ?REFERRING PROVIDER: Langley Gauss A, DO ? ? PT End of Session - 12/23/21 1535   ? ? Visit Number 3   ? Date for PT Re-Evaluation 02/06/22   ? Authorization Type UMR save   ? PT Start Time 1535   ? PT Stop Time 1884   ? PT Time Calculation (min) 38 min   ? Activity Tolerance Patient tolerated treatment well   ? Behavior During Therapy [facility] for tasks assessed/performed   ? ?  ?  ? ?  ? ? ?Past Medical History:  ?Diagnosis Date  ? Allergy   ? Anxiety and depression   ? Endometriosis   ? Hyperlipidemia   ? no meds   ? Infertility   ? Kidney stone   ? Retinoschisis   ? ?Past Surgical History:  ?Procedure Laterality Date  ? AUGMENTATION MAMMAPLASTY Bilateral   ? INTRAUTERINE DEVICE INSERTION  04/23/2017  ? PELVIC LAPAROSCOPY    ? Endometriosis  ? tongue growth removed     ? age 52   ? TYMPANOSTOMY TUBE PLACEMENT    ? x3  ? WISDOM TOOTH EXTRACTION    ? ?Patient Active Problem List  ? Diagnosis Date Noted  ? Pure hypercholesterolemia 12/29/2019  ? Constipation 12/29/2019  ? IUD (intrauterine device) in place 08/11/2017  ? Anxiety and depression 07/24/2015  ? Infertility   ? Kidney stone   ? Retinoschisis   ? Endometriosis 09/07/2001  ? Allergic rhinitis 09/07/1988  ? ? ?REFERRING DIAG: N39.3 (ICD-10-CM) - Stress incontinence (female) (female) ? ?THERAPY DIAG:  ?Muscle weakness (generalized) ? ?Abnormal posture ? ?Unspecified lack of coordination ? ?PERTINENT HISTORY: Endometriosis  ?Sexual abuse: No ? ?PRECAUTIONS: none ? ?SUBJECTIVE: Pt reports she has had great progress with urinary leakage and only see 2-3 very small instances with sneezing/coughing.  Pt also has been working on the [employer] and bladder retraining and now not having double voids and going more quickly. Now able to hold every 3 hours and not getting up at night to  urinate.  ? ?PAIN:  ?Are you having pain? No ? ? ?OBJECTIVE:  ?  ?  ?  ?COGNITION: ?           Overall cognitive status: Within functional limits for tasks assessed              ?            ?SENSATION: ?           Light touch: Appears intact ?           Proprioception: Appears intact ?  ?MUSCLE LENGTH: ?Hamstrings and adductors limited by 25% bil  ?  ?POSTURE:  ?Mild posterior pelvic tilt and rounded shoulders ?  ?PALPATION: ?Internal Pelvic Floor no TTP ?  ?External Perineal Exam WFL, no TTP ?  ?GENERAL mildly decreased tone throughout internal pelvic floor, no TTP at abdomen or external pelvis or spine ?  ?LUMBARAROM/PROM ?  ?Lumbar rotation and side bending limited by 25% ?  ?  ?  ?LE AROM/PROM: ?  ?All WFL bil ?  ?LE MMT: ?  ?Bil hips 4/5 grossly ?  ?PELVIC MMT: ?  ?MMT   ?11/06/2021  ?Vaginal 2/5; 3 reps; 3s  ?Internal Anal Sphincter    ?External Anal Sphincter    ?Puborectalis    ?Diastasis Recti    ?(  Blank rows = not tested) ?  ?  ?TONE: ?Mildly decreased ?  ?PROLAPSE: ?None ?  ?  ?TODAY'S TREATMENT  ? ?12/23/2021: ?All exercises completed with cues for breathing mechanics and pelvic floor coordination ? Bridges with ball squeeze ?2x10 hip abduction in sidelying with ball press for coordinated deep core  ?2x10 ball press opp arm/hand ?2x10 squats 10# ?Mario punches 4# 2x10 ?Lunges x10 each ? ?12/10/2021: ?Educated on bladder irritants, urge drill, and pt had several questions about pelvic floor POC, and techniques for home feedback. All answered and pt denied additional questions ?2x10 bridges with ball squeeze ?2x10 hip abduction in sidelying with ball press for coordinated deep core  ?2x10 ball press opp arm/hand ?2x10 squats ?Palloff green band x10 ?All exercises completed with cues for breathing mechanics and pelvic floor coordination ? ? ?EVAL pt educated on HEP, PT cued pt on proper technique for pelvic floor contractions, relaxations, and decreased compensatory strategies to improve strengthening and carry  over for home.  ?  ?  ?PATIENT EDUCATION:  ?Education details: coordination of pelvic floor and breathing with all tasks ?Person educated: Patient ?Education method: Explanation, Demonstration, Tactile cues, Verbal cues, and Handouts ?Education comprehension: verbalized understanding and returned demonstration ?  ?  ?HOME EXERCISE PROGRAM: ?[id number] ?  ?ASSESSMENT: ?  ?CLINICAL IMPRESSION: ?Pt presents to clinic reporting she is very pleased with progress so far. Pt reports she has only had very minor instances of leakage and overall has been able to stop urine more often than not. Pt session focused on strengthening with hip and core coordinating with pelvic floor and breathing. Increased challenge with more standing exercises this date. Pt would benefit from additional PT to further address deficits.     ?  ?OBJECTIVE IMPAIRMENTS decreased activity tolerance, decreased coordination, decreased endurance, decreased mobility, decreased strength, increased fascial restrictions, improper body mechanics, and postural dysfunction.  ?  ?ACTIVITY LIMITATIONS community activity and fitness, intercourse .  ?  ?PERSONAL FACTORS Fitness and 1-2 comorbidities: x2 vaginal births with tearing and episiotomy  are also affecting patient's functional outcome.  ?  ?  ?REHAB POTENTIAL: Good ?  ?CLINICAL DECISION MAKING: Stable/uncomplicated ?  ?EVALUATION COMPLEXITY: Low ?  ?  ?GOALS: ?Goals reviewed with patient? Yes ?  ?SHORT TERM GOALS: ?  ?Pt to be I with HEP. ?  ?Baseline:  ?Target date: 12/04/2021 ?Goal status: INITIAL ?  ?2.  Pt to report improved time between bladder voids to at least 3 hours for improved QOL with decreased urinary frequency.   ?Baseline: 2-3 but not consistently 3 hours ?Target date: 12/04/2021 ?Goal status: INITIAL ?  ?3.  Pt to demonstrate at least 3/5 pelvic floor strength for improved pelvic stability and decreased strain at pelvic floor/ decrease leakage.  ?Baseline: 2/5 ?Target date: 12/04/2021 ?Goal  status: INITIAL ?  ?  ?LONG TERM GOALS: ?  ?Pt to be I with advanced HEP. ?Baseline:  ?Target date: 02/06/2022 ?Goal status: INITIAL ?  ?2.  Pt to report improved time between bladder voids to at least 4 hours for improved QOL with decreased urinary frequency.   ?Baseline: 2-3 ?Target date: 02/06/2022 ?Goal status: INITIAL ?  ?3.  Pt to demonstrate at least 4/5 pelvic floor strength for improved pelvic stability and decreased strain at pelvic floor/ decrease leakage.  ?Baseline: 2/5 ?Target date: 02/06/2022 ?Goal status: INITIAL ?  ?4.  Pt to report no more than one urinary leakage instance with stressor per month to improve QOL and decrease symptoms.  ?Baseline:  ?  Target date: 02/06/2022 ?Goal status: INITIAL ?  ?PLAN: ?PT FREQUENCY:  every other week ?  ?PT DURATION:  10 sessions ?  ?PLANNED INTERVENTIONS: Therapeutic exercises, Therapeutic activity, Neuromuscular re-education, Patient/Family education, Joint mobilization, Dry Needling, Cryotherapy, Moist heat, scar mobilization, Taping, and Manual therapy ?  ?PLAN FOR NEXT SESSION: internal treatment if needed and pt consents ?  ?Stacy Gardner, PT, DPT ?04/18/234:25 PM  ? ? . ?PHYSICAL THERAPY DISCHARGE SUMMARY ? ?Visits from Start of Care: 3 ? ?Current functional level related to goals / functional outcomes: ?Unable to formally reassess as pt called to request DC due to work schedule.  ?  ?Remaining deficits: ?Unable to formally reassess ?  ?Education / Equipment: ?HEP  ? ?Patient agrees to discharge. Patient goals were met. Patient is being discharged due to the patient's request. ? ? ?Stacy Gardner, PT, DPT ?05/01/232:23 PM  ?

## 2021-12-24 ENCOUNTER — Ambulatory Visit
Admission: RE | Admit: 2021-12-24 | Discharge: 2021-12-24 | Disposition: A | Discharge status: Home / Self Care | Payer: 59 | Source: Ambulatory Visit

## 2021-12-24 ENCOUNTER — Other Ambulatory Visit (HOSPITAL_COMMUNITY): Payer: Self-pay

## 2021-12-24 DIAGNOSIS — Z1231 Encounter for screening mammogram for malignant neoplasm of breast: Secondary | ICD-10-CM | POA: Diagnosis not present

## 2022-01-04 ENCOUNTER — Encounter: Payer: Self-pay | Admitting: Physical Therapy

## 2022-01-06 ENCOUNTER — Encounter: Payer: 59 | Admitting: Physical Therapy

## 2022-01-06 ENCOUNTER — Other Ambulatory Visit (HOSPITAL_COMMUNITY): Payer: Self-pay

## 2022-01-20 ENCOUNTER — Encounter: Payer: 59 | Admitting: Physical Therapy

## 2022-01-28 ENCOUNTER — Other Ambulatory Visit (HOSPITAL_COMMUNITY): Payer: Self-pay

## 2022-02-03 ENCOUNTER — Encounter: Payer: 59 | Admitting: Physical Therapy

## 2022-02-11 ENCOUNTER — Other Ambulatory Visit (HOSPITAL_COMMUNITY): Payer: Self-pay

## 2022-02-26 DIAGNOSIS — L738 Other specified follicular disorders: Secondary | ICD-10-CM | POA: Diagnosis not present

## 2022-02-26 DIAGNOSIS — D1801 Hemangioma of skin and subcutaneous tissue: Secondary | ICD-10-CM | POA: Diagnosis not present

## 2022-02-26 DIAGNOSIS — D225 Melanocytic nevi of trunk: Secondary | ICD-10-CM | POA: Diagnosis not present

## 2022-02-26 DIAGNOSIS — D2271 Melanocytic nevi of right lower limb, including hip: Secondary | ICD-10-CM | POA: Diagnosis not present

## 2022-02-26 DIAGNOSIS — L821 Other seborrheic keratosis: Secondary | ICD-10-CM | POA: Diagnosis not present

## 2022-02-26 DIAGNOSIS — D2262 Melanocytic nevi of left upper limb, including shoulder: Secondary | ICD-10-CM | POA: Diagnosis not present

## 2022-02-26 DIAGNOSIS — L814 Other melanin hyperpigmentation: Secondary | ICD-10-CM | POA: Diagnosis not present

## 2022-03-03 ENCOUNTER — Other Ambulatory Visit (HOSPITAL_COMMUNITY): Payer: Self-pay

## 2022-03-04 ENCOUNTER — Other Ambulatory Visit (HOSPITAL_COMMUNITY): Payer: Self-pay

## 2022-03-16 ENCOUNTER — Other Ambulatory Visit (HOSPITAL_COMMUNITY): Payer: Self-pay

## 2022-03-21 ENCOUNTER — Other Ambulatory Visit (HOSPITAL_COMMUNITY): Payer: Self-pay

## 2022-04-10 ENCOUNTER — Other Ambulatory Visit (HOSPITAL_COMMUNITY): Payer: Self-pay

## 2022-04-13 ENCOUNTER — Other Ambulatory Visit (HOSPITAL_COMMUNITY): Payer: Self-pay

## 2022-05-15 ENCOUNTER — Other Ambulatory Visit (HOSPITAL_COMMUNITY): Payer: Self-pay

## 2022-05-20 ENCOUNTER — Ambulatory Visit: Payer: 59 | Admitting: Orthopaedic Surgery

## 2022-05-25 ENCOUNTER — Ambulatory Visit (INDEPENDENT_AMBULATORY_CARE_PROVIDER_SITE_OTHER): Payer: 59 | Admitting: Orthopaedic Surgery

## 2022-05-25 ENCOUNTER — Ambulatory Visit (INDEPENDENT_AMBULATORY_CARE_PROVIDER_SITE_OTHER): Payer: 59

## 2022-05-25 ENCOUNTER — Other Ambulatory Visit (HOSPITAL_COMMUNITY): Payer: Self-pay

## 2022-05-25 DIAGNOSIS — M5442 Lumbago with sciatica, left side: Secondary | ICD-10-CM | POA: Diagnosis not present

## 2022-05-25 DIAGNOSIS — G8929 Other chronic pain: Secondary | ICD-10-CM

## 2022-05-25 NOTE — Progress Notes (Signed)
The patient is a very pleasant and active 51 year old female who comes in for left-sided low back pain that flared up after a massage treatment.  She has also pain on the lateral aspect of her left hip area.  There is no groin pain.  She does take anti-inflammatories on occasion but really wants to know what are the best treatment options for when she does have flareups.  She is very active and young appearing 51 year old female.  Both hips move smoothly and fluidly with no pain in the groin at all.  She does have some pain to palpation over the left hip trochanteric area and the proximal IT band.  This is mild.  She has excellent flexion extension of her lumbar spine.  Her low back pain is to the left side.  2 views of the lumbar spine show normal alignment and normal disc heights with no acute findings.  The tops of both hips can be seen and appear normal.  I did show her back extension exercises as well as exercises as a relates to facet joint pain.  Also showed her stretching exercises for the trochanteric area of her left hip.  I recommended over-the-counter Voltaren gel on occasion as needed as well as ibuprofen as needed.  All question concerns were answered and addressed.  If things worsen she knows to come see [place].

## 2022-05-26 ENCOUNTER — Ambulatory Visit: Payer: 59 | Admitting: Nurse Practitioner

## 2022-05-26 ENCOUNTER — Other Ambulatory Visit (HOSPITAL_COMMUNITY): Payer: Self-pay

## 2022-05-28 ENCOUNTER — Other Ambulatory Visit (HOSPITAL_COMMUNITY): Payer: Self-pay

## 2022-05-29 ENCOUNTER — Other Ambulatory Visit (HOSPITAL_COMMUNITY): Payer: Self-pay

## 2022-06-03 ENCOUNTER — Encounter: Payer: Self-pay | Admitting: Nurse Practitioner

## 2022-06-03 ENCOUNTER — Ambulatory Visit (INDEPENDENT_AMBULATORY_CARE_PROVIDER_SITE_OTHER): Payer: 59 | Admitting: Nurse Practitioner

## 2022-06-03 ENCOUNTER — Other Ambulatory Visit (HOSPITAL_COMMUNITY): Payer: Self-pay

## 2022-06-03 VITALS — BP 90/58 | HR 70 | Ht 64.0 in | Wt 148.1 lb

## 2022-06-03 DIAGNOSIS — F331 Major depressive disorder, recurrent, moderate: Secondary | ICD-10-CM | POA: Insufficient documentation

## 2022-06-03 DIAGNOSIS — H9193 Unspecified hearing loss, bilateral: Secondary | ICD-10-CM | POA: Insufficient documentation

## 2022-06-03 DIAGNOSIS — R0683 Snoring: Secondary | ICD-10-CM | POA: Diagnosis not present

## 2022-06-03 DIAGNOSIS — Z7689 Persons encountering health services in other specified circumstances: Secondary | ICD-10-CM | POA: Diagnosis not present

## 2022-06-03 DIAGNOSIS — J301 Allergic rhinitis due to pollen: Secondary | ICD-10-CM

## 2022-06-03 DIAGNOSIS — E782 Mixed hyperlipidemia: Secondary | ICD-10-CM | POA: Diagnosis not present

## 2022-06-03 MED ORDER — BUPROPION HCL ER (XL) 150 MG PO TB24
150.0000 mg | ORAL_TABLET | Freq: Every day | ORAL | 1 refills | Status: DC
  Filled 2022-06-03: qty 30, 30d supply, fill #0
  Filled 2022-06-29: qty 30, 30d supply, fill #1

## 2022-06-03 NOTE — Progress Notes (Signed)
New Patient Office Visit  Subjective    Patient ID: Dominique Powell, female    DOB: 05-07-1971  Age: 51 y.o. MRN: [id number]  CC:  Chief Complaint  Patient presents with   New Patient (Initial Visit)    HPI [name] presents to establish care Has not seen PCP in a few years  -has kept up to date with [facility].  -recently started on HRT per her GYN provider.  --started her on estrogen patch. This has worked well.  --last GYN provider, she did have full lab panel done.  --history of high cholesterol  -has had emotional symptoms related to menopausal  -has been taking lexapro 20 mg since 2014.  -does have days where depression is bad. Understands that she is going to have a "sad" day. States that her husband has noted that she is having more of these days than she used to. Now occurring about once per week.  -states that the next day, after a sad day, she will be fine.  --symptoms are effecting her work. Gets very fatigued. Has trouble getting work done.  -the patient did see counselor for anxiety and depression. Learned coping mechanisms which have really helped. She feels like anxiety is not really a big issue for her any longer.   Patient's husband has told her that she snores loudly and sometimes stops breathing. She states that this does not happen every night. It is worse if she is overtired. -she denies excess daytime sleepiness -she does not fall asleep while driving, reading, or sitting still for a long time   Having trouble hearing.  -unsure if this is certain tones that are problematic to hear.  -did have tubes in her ears when she was younger. Had rupture ear drum also.   Outpatient Encounter Medications as of 06/03/2022  Medication Sig   buPROPion (WELLBUTRIN XL) 150 MG 24 hr tablet Take 1 tablet (150 mg total) by mouth daily.   cetirizine (ZYRTEC) 10 MG tablet Take 10 mg by mouth daily.   escitalopram (LEXAPRO) 20 MG tablet Take 1 tablet (20 mg  total) by mouth daily.   estradiol (CLIMARA - DOSED IN MG/24 HR) 0.05 mg/24hr patch Apply 1 patch (0.05 mg total) onto the skin once a week.   valACYclovir (VALTREX) 1000 MG tablet Take 1 tablet (1,000 mg total) by mouth every 12 (twelve) hours as needed.   [DISCONTINUED] ADVIL 200 MG tablet Take 400 mg by mouth every 6 (six) hours as needed.    [DISCONTINUED] Cholecalciferol (VITAMIN D PO) Take by mouth.   [DISCONTINUED] escitalopram (LEXAPRO) 20 MG tablet TAKE 1 TABLET (20 MG TOTAL) BY MOUTH DAILY.   [DISCONTINUED] fluticasone (FLONASE) 50 MCG/ACT nasal spray Place 2 sprays into both nostrils daily.   [DISCONTINUED] hydroquinone 4 % cream Apply to face up to twice daily   [DISCONTINUED] Multiple Vitamin (MULTIVITAMIN) tablet Take 1 tablet by mouth daily.   [DISCONTINUED] Omega-3 Fatty Acids (FISH OIL) 1000 MG CAPS    [DISCONTINUED] phentermine (ADIPEX-P) 37.5 MG tablet Take 1 tablet (37.5 mg total) by mouth daily.   [DISCONTINUED] phentermine (ADIPEX-P) 37.5 MG tablet Take 1 tablet (37.5 mg total) by mouth daily.   [DISCONTINUED] phentermine 37.5 MG capsule TAKE 1 CAPSULE BY MOUTH DAILY IN THE MORNING   [DISCONTINUED] valACYclovir (VALTREX) 500 MG tablet Take 1 tablet by mouth twice a day for 3-5 days as needed (Patient not taking: Reported on 07/25/2020)   [DISCONTINUED] Zoster Vaccine Adjuvanted [facility]) injection Inject  0.5 mLs into the muscle.   Facility-Administered Encounter Medications as of 06/03/2022  Medication   levonorgestrel (MIRENA) 20 MCG/24HR IUD    Past Medical History:  Diagnosis Date   Allergy    Anxiety and depression    Endometriosis    Hyperlipidemia    no meds    Infertility    Kidney stone    Retinoschisis     Past Surgical History:  Procedure Laterality Date   AUGMENTATION MAMMAPLASTY Bilateral    INTRAUTERINE DEVICE INSERTION  04/23/2017   PELVIC LAPAROSCOPY     Endometriosis   tongue growth removed      age 36    TYMPANOSTOMY TUBE PLACEMENT      x3   WISDOM TOOTH EXTRACTION      Family History  Problem Relation Age of Onset   Hypertension Maternal Grandmother    Rheumatic fever Maternal Grandmother    Hypertension Paternal Grandfather    Heart attack Paternal Grandmother 79   Kidney disease Father    Hypertension Father    Hyperlipidemia Father    Breast cancer Neg Hx    Colon cancer Neg Hx    Colon polyps Neg Hx    Esophageal cancer Neg Hx    Rectal cancer Neg Hx    Stomach cancer Neg Hx     Social History   Socioeconomic History   Marital status: Married    Spouse name: Not on file   Number of children: Not on file   Years of education: Not on file   Highest education level: Not on file  Occupational History   Not on file  Tobacco Use   Smoking status: Never   Smokeless tobacco: Never  Vaping Use   Vaping Use: Never used  Substance and Sexual Activity   Alcohol use: Yes    Comment: social    Drug use: No   Sexual activity: Yes    Partners: Male    Birth control/protection: I.U.D.    Comment: 1st intercourse- 16, partners-5,  married- 77 yrs   Other Topics Concern   Not on file  Social History Narrative   Not on file   Social Determinants of Health   Financial Resource Strain: Not on file  Food Insecurity: Not on file  Transportation Needs: Not on file  Physical Activity: Not on file  Stress: Not on file  Social Connections: Not on file  Intimate Partner Violence: Not on file    Review of Systems  Constitutional:  Positive for malaise/fatigue. Negative for chills and fever.  HENT:  Negative for congestion, sinus pain and sore throat.        States that her ears often feel clogged and sometimes it is hard to hear.   Eyes: Negative.   Respiratory:  Negative for cough, shortness of breath and wheezing.   Cardiovascular:  Negative for chest pain, palpitations and leg swelling.  Gastrointestinal:  Negative for constipation, diarrhea, nausea and vomiting.  Genitourinary: Negative.    Musculoskeletal:  Negative for myalgias.  Skin: Negative.   Neurological:  Negative for dizziness and headaches.  Endo/Heme/Allergies:  Does not bruise/bleed easily.  Psychiatric/Behavioral:  Positive for depression. The patient is not nervous/anxious.   All other systems reviewed and are negative.       Objective    Today's Vitals   06/03/22 1428  BP: (Abnormal) 90/58  Pulse: 70  SpO2: 100%  Weight: 148 lb 1.9 oz (67.2 kg)  Height: '5\' 4"'$  (1.626 m)   Body mass  index is 25.42 kg/m.   Physical Exam Vitals and nursing note reviewed.  Constitutional:      Appearance: Normal appearance. She is well-developed.  HENT:     Head: Normocephalic and atraumatic.     Right Ear: Hearing, tympanic membrane, ear canal and external ear normal.     Left Ear: Ear canal and external ear normal. Tympanic membrane is bulging.     Ears:     Comments: Small amount of wax in outer ear canal noted     Nose: Nose normal.     Mouth/Throat:     Mouth: Mucous membranes are moist.     Pharynx: Oropharynx is clear.  Eyes:     Extraocular Movements: Extraocular movements intact.     Conjunctiva/sclera: Conjunctivae normal.     Pupils: Pupils are equal, round, and reactive to light.  Cardiovascular:     Rate and Rhythm: Normal rate and regular rhythm.     Pulses: Normal pulses.     Heart sounds: Normal heart sounds.  Pulmonary:     Effort: Pulmonary effort is normal.     Breath sounds: Normal breath sounds.  Abdominal:     Palpations: Abdomen is soft.  Musculoskeletal:        General: Normal range of motion.     Cervical back: Normal range of motion and neck supple.  Lymphadenopathy:     Cervical: No cervical adenopathy.  Skin:    General: Skin is warm and dry.     Capillary Refill: Capillary refill takes less than 2 seconds.  Neurological:     General: No focal deficit present.     Mental Status: She is alert and oriented to person, place, and time.  Psychiatric:        Mood and  Affect: Mood normal.        Behavior: Behavior normal.        Thought Content: Thought content normal.        Judgment: Judgment normal.       Assessment & Plan:  1. Moderate episode of recurrent major depressive disorder (HCC) Currently on lexapro 20 mg daily. Will add wellbutrin XL 150 mg daily in the morning. Discussed method to taper lexapro down to 10 mg daily. Will reassess symptoms on 4 weeks.  - buPROPion (WELLBUTRIN XL) 150 MG 24 hr tablet; Take 1 tablet (150 mg total) by mouth daily.  Dispense: 30 tablet; Refill: 1  2. Loud snoring This is intermittent and may be related to chronic allergic rhinitis. Encouraged her to do trial breath right strips and Flonase nasal spray. Reassess in four weeks.  3. Allergic rhinitis due to pollen, unspecified seasonality Recommended addition of flonase nasal spray. Reassess at next visit   4. Decreased hearing of both ears Ear canals appear essentially normal. Small amount of wax in outer right ear canal. If this becomes worse or bothersome, will refer to ENT or audiology.   5. Mixed hyperlipidemia Request lab results from GYN to review and update her chart.   6. Encounter to establish care Appointment today to establish new primary care provider      Problem List Items Addressed This Visit       Respiratory   Allergic rhinitis (Chronic)     Other   Moderate episode of recurrent major depressive disorder (HCC) - Primary   Relevant Medications   buPROPion (WELLBUTRIN XL) 150 MG 24 hr tablet   Loud snoring   Decreased hearing of both ears   Mixed  hyperlipidemia   Other Visit Diagnoses     Encounter to establish care           Return in about 4 weeks (around 07/01/2022) for mood - can we get pap and labs from [place]? thanks .   Ronnell Freshwater, NP

## 2022-06-12 ENCOUNTER — Other Ambulatory Visit (HOSPITAL_COMMUNITY): Payer: Self-pay

## 2022-06-19 DIAGNOSIS — H52223 Regular astigmatism, bilateral: Secondary | ICD-10-CM | POA: Diagnosis not present

## 2022-06-19 DIAGNOSIS — H524 Presbyopia: Secondary | ICD-10-CM | POA: Diagnosis not present

## 2022-06-19 DIAGNOSIS — H5213 Myopia, bilateral: Secondary | ICD-10-CM | POA: Diagnosis not present

## 2022-06-23 ENCOUNTER — Other Ambulatory Visit (HOSPITAL_COMMUNITY): Payer: Self-pay

## 2022-06-30 ENCOUNTER — Other Ambulatory Visit (HOSPITAL_COMMUNITY): Payer: Self-pay

## 2022-07-01 ENCOUNTER — Ambulatory Visit: Payer: 59 | Admitting: Nurse Practitioner

## 2022-07-01 ENCOUNTER — Other Ambulatory Visit (HOSPITAL_COMMUNITY): Payer: Self-pay

## 2022-07-01 ENCOUNTER — Encounter: Payer: Self-pay | Admitting: Nurse Practitioner

## 2022-07-01 VITALS — BP 99/64 | HR 79 | Ht 64.0 in | Wt 145.1 lb

## 2022-07-01 DIAGNOSIS — F419 Anxiety disorder, unspecified: Secondary | ICD-10-CM

## 2022-07-01 DIAGNOSIS — F331 Major depressive disorder, recurrent, moderate: Secondary | ICD-10-CM | POA: Diagnosis not present

## 2022-07-01 DIAGNOSIS — R5383 Other fatigue: Secondary | ICD-10-CM

## 2022-07-01 DIAGNOSIS — F32A Depression, unspecified: Secondary | ICD-10-CM

## 2022-07-01 MED ORDER — ESCITALOPRAM OXALATE 10 MG PO TABS
10.0000 mg | ORAL_TABLET | Freq: Every day | ORAL | 2 refills | Status: DC
  Filled 2022-07-01: qty 30, 30d supply, fill #0

## 2022-07-01 MED ORDER — BUPROPION HCL ER (SR) 200 MG PO TB12
200.0000 mg | ORAL_TABLET | Freq: Two times a day (BID) | ORAL | 2 refills | Status: DC
  Filled 2022-07-01: qty 60, 30d supply, fill #0
  Filled 2022-07-27: qty 60, 30d supply, fill #1
  Filled 2022-08-17: qty 60, 30d supply, fill #2

## 2022-07-01 NOTE — Progress Notes (Signed)
Established patient visit   Patient: Dominique Powell   DOB: 1971/06/21   51 y.o. Female  MRN: [id number] Visit Date: 07/01/2022   Chief Complaint  Patient presents with   Follow-up   Subjective    HPI  Follow up  -mood  --added Wellbutrin KL 150 mg daily.  --cut back lexapro to 10 mg in the evening.  -she states that she noted an improvement in the way seh felt nearly right away.  -continues to have a tough time with severe fatigue in the mornings.  -can feel when wellbutrin starts to kick in around 10:30 am. At that point, she starts feeling much more herself.  -she has had less days when she cries for no reason.  -she states that she did try stopping her lexapro altogether, but this caused her to feel a bit dizzy and poor in general. She has started back to taking it at 10 mg every evening.    Medications: Outpatient Medications Prior to Visit  Medication Sig   cetirizine (ZYRTEC) 10 MG tablet Take 10 mg by mouth daily.   estradiol (CLIMARA - DOSED IN MG/24 HR) 0.05 mg/24hr patch Apply 1 patch (0.05 mg total) onto the skin once a week.   valACYclovir (VALTREX) 1000 MG tablet Take 1 tablet (1,000 mg total) by mouth every 12 (twelve) hours as needed.   [DISCONTINUED] buPROPion (WELLBUTRIN XL) 150 MG 24 hr tablet Take 1 tablet (150 mg total) by mouth daily.   [DISCONTINUED] escitalopram (LEXAPRO) 20 MG tablet Take 1 tablet (20 mg total) by mouth daily.   Facility-Administered Medications Prior to Visit  Medication Dose Route Frequency Provider   levonorgestrel (MIRENA) 20 MCG/24HR IUD   Intrauterine Once Gottsegen, Cherly Anderson, MD    Review of Systems  Constitutional:  Positive for fatigue. Negative for activity change, appetite change, chills and fever.       This is mostly in the mornings   HENT:  Negative for congestion, postnasal drip, rhinorrhea, sinus pressure, sinus pain, sneezing and sore throat.   Eyes: Negative.   Respiratory:  Negative for cough, chest tightness,  shortness of breath and wheezing.   Cardiovascular:  Negative for chest pain and palpitations.  Gastrointestinal:  Negative for abdominal pain, constipation, diarrhea, nausea and vomiting.  Endocrine: Negative for cold intolerance, heat intolerance, polydipsia and polyuria.  Genitourinary:  Negative for dyspareunia, dysuria, flank pain, frequency and urgency.  Musculoskeletal:  Negative for arthralgias, back pain and myalgias.  Skin:  Negative for rash.  Allergic/Immunologic: Negative for environmental allergies.  Neurological:  Negative for dizziness, weakness and headaches.  Hematological:  Negative for adenopathy.  Psychiatric/Behavioral:  Positive for decreased concentration. The patient is nervous/anxious.        Improved        Objective     Today's Vitals   07/01/22 1403  BP: 99/64  Pulse: 79  SpO2: 100%  Weight: 145 lb 1.9 oz (65.8 kg)  Height: '5\' 4"'$  (1.626 m)   Body mass index is 24.91 kg/m.  BP Readings from Last 3 Encounters:  07/01/22 99/64  06/03/22 (Abnormal) 90/58  07/25/20 121/70    Wt Readings from Last 3 Encounters:  07/01/22 145 lb 1.9 oz (65.8 kg)  06/03/22 148 lb 1.9 oz (67.2 kg)  07/25/20 158 lb (71.7 kg)    Physical Exam Vitals and nursing note reviewed.  Constitutional:      Appearance: Normal appearance. She is well-developed.  HENT:     Head: Normocephalic and atraumatic.  Eyes:  Pupils: Pupils are equal, round, and reactive to light.  Cardiovascular:     Rate and Rhythm: Normal rate and regular rhythm.     Pulses: Normal pulses.     Heart sounds: Normal heart sounds.  Pulmonary:     Effort: Pulmonary effort is normal.     Breath sounds: Normal breath sounds.  Abdominal:     Palpations: Abdomen is soft.  Musculoskeletal:        General: Normal range of motion.     Cervical back: Normal range of motion and neck supple.  Lymphadenopathy:     Cervical: No cervical adenopathy.  Skin:    General: Skin is warm and dry.      Capillary Refill: Capillary refill takes less than 2 seconds.  Neurological:     General: No focal deficit present.     Mental Status: She is alert and oriented to person, place, and time.  Psychiatric:        Mood and Affect: Mood normal.        Behavior: Behavior normal.        Thought Content: Thought content normal.        Judgment: Judgment normal.       Assessment & Plan    1. Other fatigue Improving with medication changes.   2. Moderate episode of recurrent major depressive disorder (HCC) Increase Wellbutrin SR to 200 mg twice daily. Continue lexapro 10 mg daily.  - buPROPion (WELLBUTRIN SR) 200 MG 12 hr tablet; Take 1 tablet (200 mg total) by mouth 2 (two) times daily.  Dispense: 60 tablet; Refill: 2 - escitalopram (LEXAPRO) 10 MG tablet; Take 1 tablet (10 mg total) by mouth daily.  Dispense: 30 tablet; Refill: 2  3. Anxiety and depression Increase Wellbutrin SR to 200 mg twice daily. Continue lexapro 10 mg daily.  - buPROPion (WELLBUTRIN SR) 200 MG 12 hr tablet; Take 1 tablet (200 mg total) by mouth 2 (two) times daily.  Dispense: 60 tablet; Refill: 2 - escitalopram (LEXAPRO) 10 MG tablet; Take 1 tablet (10 mg total) by mouth daily.  Dispense: 30 tablet; Refill: 2   Problem List Items Addressed This Visit       Other   Anxiety and depression   Relevant Medications   buPROPion (WELLBUTRIN SR) 200 MG 12 hr tablet   escitalopram (LEXAPRO) 10 MG tablet   Moderate episode of recurrent major depressive disorder (HCC)   Relevant Medications   buPROPion (WELLBUTRIN SR) 200 MG 12 hr tablet   escitalopram (LEXAPRO) 10 MG tablet   Other fatigue - Primary     Return in about 4 weeks (around 07/29/2022) for mood - changed dosing of lexapro and wellbutrin .         Ronnell Freshwater, NP  [facility] [phone] (phone) [phone] (fax)  [place]

## 2022-07-13 ENCOUNTER — Other Ambulatory Visit (HOSPITAL_COMMUNITY): Payer: Self-pay

## 2022-07-14 ENCOUNTER — Other Ambulatory Visit (HOSPITAL_COMMUNITY): Payer: Self-pay

## 2022-07-19 DIAGNOSIS — R5383 Other fatigue: Secondary | ICD-10-CM | POA: Insufficient documentation

## 2022-07-22 ENCOUNTER — Encounter: Payer: Self-pay | Admitting: Internal Medicine

## 2022-07-27 ENCOUNTER — Other Ambulatory Visit (HOSPITAL_COMMUNITY): Payer: Self-pay

## 2022-07-31 ENCOUNTER — Other Ambulatory Visit (HOSPITAL_COMMUNITY): Payer: Self-pay

## 2022-08-06 ENCOUNTER — Ambulatory Visit: Payer: 59 | Admitting: Nurse Practitioner

## 2022-08-06 ENCOUNTER — Encounter: Payer: Self-pay | Admitting: Nurse Practitioner

## 2022-08-06 VITALS — BP 108/73 | HR 72 | Resp 20 | Ht 64.0 in | Wt 145.0 lb

## 2022-08-06 DIAGNOSIS — F331 Major depressive disorder, recurrent, moderate: Secondary | ICD-10-CM

## 2022-08-06 DIAGNOSIS — R5383 Other fatigue: Secondary | ICD-10-CM | POA: Diagnosis not present

## 2022-08-11 ENCOUNTER — Telehealth: Payer: 59 | Admitting: Family Medicine

## 2022-08-11 ENCOUNTER — Other Ambulatory Visit (HOSPITAL_COMMUNITY): Payer: Self-pay

## 2022-08-11 DIAGNOSIS — R3989 Other symptoms and signs involving the genitourinary system: Secondary | ICD-10-CM

## 2022-08-11 MED ORDER — NITROFURANTOIN MONOHYD MACRO 100 MG PO CAPS
100.0000 mg | ORAL_CAPSULE | Freq: Two times a day (BID) | ORAL | 0 refills | Status: AC
  Filled 2022-08-11: qty 10, 5d supply, fill #0

## 2022-08-11 NOTE — Progress Notes (Signed)

## 2022-08-17 ENCOUNTER — Other Ambulatory Visit (HOSPITAL_COMMUNITY): Payer: Self-pay

## 2022-08-18 ENCOUNTER — Other Ambulatory Visit: Payer: Self-pay

## 2022-08-18 ENCOUNTER — Other Ambulatory Visit (HOSPITAL_COMMUNITY): Payer: Self-pay

## 2022-08-23 ENCOUNTER — Encounter: Payer: Self-pay | Admitting: Nurse Practitioner

## 2022-08-24 ENCOUNTER — Other Ambulatory Visit: Payer: Self-pay | Admitting: Nurse Practitioner

## 2022-08-24 ENCOUNTER — Other Ambulatory Visit (HOSPITAL_COMMUNITY): Payer: Self-pay

## 2022-08-24 DIAGNOSIS — R112 Nausea with vomiting, unspecified: Secondary | ICD-10-CM

## 2022-08-24 DIAGNOSIS — F331 Major depressive disorder, recurrent, moderate: Secondary | ICD-10-CM

## 2022-08-24 MED ORDER — BUPROPION HCL ER (SR) 150 MG PO TB12
150.0000 mg | ORAL_TABLET | Freq: Two times a day (BID) | ORAL | 2 refills | Status: DC
  Filled 2022-08-24: qty 60, 30d supply, fill #0
  Filled 2022-10-31: qty 60, 30d supply, fill #1
  Filled 2022-12-06: qty 60, 30d supply, fill #2

## 2022-08-24 MED ORDER — ONDANSETRON HCL 4 MG PO TABS
4.0000 mg | ORAL_TABLET | Freq: Three times a day (TID) | ORAL | 0 refills | Status: DC | PRN
  Filled 2022-08-24: qty 45, 8d supply, fill #0

## 2022-08-24 NOTE — Progress Notes (Signed)
Established patient visit   Patient: Dominique Powell   DOB: 09-19-1970   51 y.o. Female  MRN: [id number] Visit Date: 08/06/2022   Chief Complaint  Patient presents with   Depression   Subjective    Depression        Associated symptoms include decreased concentration.  Associated symptoms include no fatigue, no appetite change, no myalgias and no headaches.   Follow up  -mood --improved and doing well.  ---taking wellbutrin SR 200 mg twice daily  ---lexapro 10 mg daily --states that depressive and anxious symptoms are doing much better.  -denies chest pain, chest pressure, or shortness of breath. He denies headaches or visual disturbances. He denies abdominal pain, nausea, vomiting, or changes in bowel or bladder habits.      Medications: Outpatient Medications Prior to Visit  Medication Sig   buPROPion (WELLBUTRIN SR) 200 MG 12 hr tablet Take 1 tablet (200 mg total) by mouth 2 (two) times daily.   cetirizine (ZYRTEC) 10 MG tablet Take 10 mg by mouth daily.   escitalopram (LEXAPRO) 10 MG tablet Take 1 tablet (10 mg total) by mouth daily. (Patient taking differently: Take 5 mg by mouth daily.)   estradiol (CLIMARA - DOSED IN MG/24 HR) 0.05 mg/24hr patch Apply 1 patch (0.05 mg total) onto the skin once a week.   valACYclovir (VALTREX) 1000 MG tablet Take 1 tablet (1,000 mg total) by mouth every 12 (twelve) hours as needed.   Facility-Administered Medications Prior to Visit  Medication Dose Route Frequency Provider   levonorgestrel (MIRENA) 20 MCG/24HR IUD   Intrauterine Once Gottsegen, Cherly Anderson, MD    Review of Systems  Constitutional:  Negative for activity change, appetite change, chills, fatigue and fever.  HENT:  Negative for congestion, postnasal drip, rhinorrhea, sinus pressure, sinus pain, sneezing and sore throat.   Eyes: Negative.   Respiratory:  Negative for cough, chest tightness, shortness of breath and wheezing.   Cardiovascular:  Negative for chest pain and  palpitations.  Gastrointestinal:  Negative for abdominal pain, constipation, diarrhea, nausea and vomiting.  Endocrine: Negative for cold intolerance, heat intolerance, polydipsia and polyuria.  Genitourinary:  Negative for dyspareunia, dysuria, flank pain, frequency and urgency.  Musculoskeletal:  Negative for arthralgias, back pain and myalgias.  Skin:  Negative for rash.  Allergic/Immunologic: Negative for environmental allergies.  Neurological:  Negative for dizziness, weakness and headaches.  Hematological:  Negative for adenopathy.  Psychiatric/Behavioral:  Positive for decreased concentration and depression. The patient is nervous/anxious.        Improved     Last CBC Lab Results  Component Value Date   WBC 4.8 08/21/2019   HGB 12.9 08/21/2019   HCT 38.3 08/21/2019   MCV 92.5 08/21/2019   MCH 31.2 08/21/2019   RDW 11.8 08/21/2019   PLT 250 08/65/7846   Last metabolic panel Lab Results  Component Value Date   GLUCOSE 83 08/21/2019   NA 139 08/21/2019   K 4.2 08/21/2019   CL 100 08/21/2019   CO2 29 08/21/2019   BUN 13 08/21/2019   CREATININE 0.77 08/21/2019   CALCIUM 9.6 08/21/2019   PROT 7.0 08/21/2019   ALBUMIN 4.7 08/04/2016   BILITOT 1.1 08/21/2019   ALKPHOS 54 08/04/2016   AST 24 08/21/2019   ALT 24 08/21/2019   Last lipids Lab Results  Component Value Date   CHOL 210 (H) 08/21/2019   HDL 77 08/21/2019   LDLCALC 119 (H) 08/21/2019   TRIG 57 08/21/2019   CHOLHDL 2.7 08/21/2019  Last hemoglobin A1c No results found for: "HGBA1C" Last thyroid functions Lab Results  Component Value Date   TSH 1.89 08/11/2017   Last vitamin D Lab Results  Component Value Date   VD25OH 34 11/13/2016       Objective     Today's Vitals   08/06/22 1120  BP: 108/73  Pulse: 72  Resp: 20  SpO2: 100%  Weight: 145 lb (65.8 kg)  Height: '5\' 4"'$  (1.626 m)   Body mass index is 24.89 kg/m.  BP Readings from Last 3 Encounters:  08/06/22 108/73  07/01/22 99/64   06/03/22 (Abnormal) 90/58    Wt Readings from Last 3 Encounters:  08/06/22 145 lb (65.8 kg)  07/01/22 145 lb 1.9 oz (65.8 kg)  06/03/22 148 lb 1.9 oz (67.2 kg)    Physical Exam Vitals and nursing note reviewed.  Constitutional:      Appearance: Normal appearance. She is well-developed.  HENT:     Head: Normocephalic and atraumatic.  Eyes:     Pupils: Pupils are equal, round, and reactive to light.  Cardiovascular:     Rate and Rhythm: Normal rate and regular rhythm.     Pulses: Normal pulses.     Heart sounds: Normal heart sounds.  Pulmonary:     Effort: Pulmonary effort is normal.     Breath sounds: Normal breath sounds.  Abdominal:     Palpations: Abdomen is soft.  Musculoskeletal:        General: Normal range of motion.     Cervical back: Normal range of motion and neck supple.  Lymphadenopathy:     Cervical: No cervical adenopathy.  Skin:    General: Skin is warm and dry.     Capillary Refill: Capillary refill takes less than 2 seconds.  Neurological:     General: No focal deficit present.     Mental Status: She is alert and oriented to person, place, and time.  Psychiatric:        Mood and Affect: Mood normal.        Behavior: Behavior normal.        Thought Content: Thought content normal.        Judgment: Judgment normal.      Assessment & Plan    1. Other fatigue Some fatigue in the mornings but otherwise improved.   2. Moderate episode of recurrent major depressive disorder (HCC) Improved. Continue wellbutrin SR 200 mg twice daily. She plans to gradually wean herself off of lexapro. Reassess in 3 months.     Problem List Items Addressed This Visit       Other   Moderate episode of recurrent major depressive disorder ([place])   Other fatigue - Primary     Return in about 4 months (around 12/05/2022) for Mood.         Ronnell Freshwater, NP  [facility] at [facility] [phone] (phone) [phone] (fax)  [place]

## 2022-09-03 ENCOUNTER — Other Ambulatory Visit: Payer: Self-pay

## 2022-09-21 ENCOUNTER — Other Ambulatory Visit (HOSPITAL_COMMUNITY): Payer: Self-pay

## 2022-09-21 ENCOUNTER — Other Ambulatory Visit: Payer: Self-pay

## 2022-09-22 ENCOUNTER — Other Ambulatory Visit (HOSPITAL_COMMUNITY): Payer: Self-pay

## 2022-09-23 ENCOUNTER — Other Ambulatory Visit (HOSPITAL_COMMUNITY): Payer: Self-pay

## 2022-09-23 ENCOUNTER — Telehealth: Payer: 59 | Admitting: Physician Assistant

## 2022-09-23 DIAGNOSIS — R3989 Other symptoms and signs involving the genitourinary system: Secondary | ICD-10-CM

## 2022-09-23 MED ORDER — CEPHALEXIN 500 MG PO CAPS
500.0000 mg | ORAL_CAPSULE | Freq: Two times a day (BID) | ORAL | 0 refills | Status: AC
  Filled 2022-09-23: qty 14, 7d supply, fill #0

## 2022-09-23 NOTE — Progress Notes (Signed)

## 2022-09-23 NOTE — Progress Notes (Signed)
I have spent 5 minutes in review of e-visit questionnaire, review and updating patient chart, medical decision making and response to patient.   Ronan Duecker Cody Arlen Dupuis, PA-C    

## 2022-10-07 DIAGNOSIS — H43811 Vitreous degeneration, right eye: Secondary | ICD-10-CM | POA: Diagnosis not present

## 2022-10-07 DIAGNOSIS — H33193 Other retinoschisis and retinal cysts, bilateral: Secondary | ICD-10-CM | POA: Diagnosis not present

## 2022-10-07 DIAGNOSIS — H2513 Age-related nuclear cataract, bilateral: Secondary | ICD-10-CM | POA: Diagnosis not present

## 2022-10-14 ENCOUNTER — Other Ambulatory Visit (HOSPITAL_COMMUNITY): Payer: Self-pay

## 2022-10-14 DIAGNOSIS — Z131 Encounter for screening for diabetes mellitus: Secondary | ICD-10-CM | POA: Diagnosis not present

## 2022-10-14 DIAGNOSIS — Z6824 Body mass index (BMI) 24.0-24.9, adult: Secondary | ICD-10-CM | POA: Diagnosis not present

## 2022-10-14 DIAGNOSIS — Z1329 Encounter for screening for other suspected endocrine disorder: Secondary | ICD-10-CM | POA: Diagnosis not present

## 2022-10-14 DIAGNOSIS — Z13220 Encounter for screening for lipoid disorders: Secondary | ICD-10-CM | POA: Diagnosis not present

## 2022-10-14 DIAGNOSIS — Z0000 Encounter for general adult medical examination without abnormal findings: Secondary | ICD-10-CM | POA: Diagnosis not present

## 2022-10-14 DIAGNOSIS — Z01419 Encounter for gynecological examination (general) (routine) without abnormal findings: Secondary | ICD-10-CM | POA: Diagnosis not present

## 2022-10-14 MED ORDER — PROGESTERONE MICRONIZED 100 MG PO CAPS
100.0000 mg | ORAL_CAPSULE | Freq: Every day | ORAL | 11 refills | Status: DC
  Filled 2022-10-14: qty 30, 30d supply, fill #0
  Filled 2022-11-05 – 2022-11-09 (×2): qty 30, 30d supply, fill #1
  Filled 2022-12-07: qty 30, 30d supply, fill #2
  Filled 2023-01-04: qty 30, 30d supply, fill #3
  Filled 2023-02-10: qty 30, 30d supply, fill #4
  Filled 2023-03-24: qty 30, 30d supply, fill #5
  Filled 2023-04-19: qty 30, 30d supply, fill #6
  Filled 2023-05-22: qty 30, 30d supply, fill #7
  Filled 2023-06-18: qty 30, 30d supply, fill #8
  Filled 2023-07-18: qty 30, 30d supply, fill #9
  Filled 2023-08-15: qty 30, 30d supply, fill #10
  Filled 2023-09-03 – 2023-09-09 (×2): qty 30, 30d supply, fill #11

## 2022-10-14 MED ORDER — VALACYCLOVIR HCL 1 G PO TABS
1000.0000 mg | ORAL_TABLET | Freq: Two times a day (BID) | ORAL | 6 refills | Status: DC | PRN
  Filled 2022-10-14: qty 6, 3d supply, fill #0
  Filled 2023-08-07: qty 6, 3d supply, fill #1

## 2022-10-14 MED ORDER — ESTRADIOL 0.05 MG/24HR TD PTWK
0.0500 mg | MEDICATED_PATCH | TRANSDERMAL | 11 refills | Status: DC
  Filled 2022-10-14: qty 4, 28d supply, fill #0
  Filled 2022-11-01: qty 4, 28d supply, fill #1
  Filled 2022-12-06: qty 4, 28d supply, fill #2
  Filled 2023-01-04: qty 4, 28d supply, fill #3
  Filled 2023-02-10: qty 4, 28d supply, fill #4

## 2022-10-15 ENCOUNTER — Other Ambulatory Visit (HOSPITAL_COMMUNITY): Payer: Self-pay

## 2022-10-15 MED ORDER — PHENTERMINE HCL 37.5 MG PO TABS
ORAL_TABLET | ORAL | 0 refills | Status: DC
  Filled 2022-10-15: qty 90, 90d supply, fill #0

## 2022-11-02 ENCOUNTER — Other Ambulatory Visit (HOSPITAL_COMMUNITY): Payer: Self-pay

## 2022-11-05 ENCOUNTER — Other Ambulatory Visit: Payer: Self-pay

## 2022-11-05 ENCOUNTER — Other Ambulatory Visit (HOSPITAL_COMMUNITY): Payer: Self-pay

## 2022-11-06 ENCOUNTER — Other Ambulatory Visit (HOSPITAL_COMMUNITY): Payer: Self-pay

## 2022-12-01 ENCOUNTER — Other Ambulatory Visit: Payer: Self-pay | Admitting: Obstetrics and Gynecology

## 2022-12-01 DIAGNOSIS — Z1231 Encounter for screening mammogram for malignant neoplasm of breast: Secondary | ICD-10-CM

## 2022-12-06 NOTE — Progress Notes (Unsigned)
Established patient visit   Patient: Dominique Powell   DOB: May 04, 1971   52 y.o. Female  MRN: [id number] Visit Date: 12/07/2022   No chief complaint on file.  Subjective    HPI  Follow up  -mood --stable at last visit.  --on Wellbutrin SR 200 mg twice daily and lexapro 10 mg.   -due for CPE -due for routine, fasting labs  -?GYN provider   Medications: Outpatient Medications Prior to Visit  Medication Sig   buPROPion (WELLBUTRIN SR) 150 MG 12 hr tablet Take 1 tablet (150 mg total) by mouth 2 (two) times daily.   cetirizine (ZYRTEC) 10 MG tablet Take 10 mg by mouth daily.   estradiol (CLIMARA - DOSED IN MG/24 HR) 0.05 mg/24hr patch Place 1 patch (0.05 mg total) onto the skin once a week.   ondansetron (ZOFRAN) 4 MG tablet Take 1-2 tablets (4-8 mg total) by mouth every 8 (eight) hours as needed for nausea or vomiting.   phentermine (ADIPEX-P) 37.5 MG tablet Take 1 tablet every day by mouth as directed for 90 days.   progesterone (PROMETRIUM) 100 MG capsule Take 1 capsule (100 mg total) by mouth daily.   valACYclovir (VALTREX) 1000 MG tablet Take 1 tablet (1,000 mg total) by mouth every 12 (twelve) hours as needed for 3 days   Facility-Administered Medications Prior to Visit  Medication Dose Route Frequency Provider   levonorgestrel (MIRENA) 20 MCG/24HR IUD   Intrauterine Once Bennetta Laos, MD    Review of Systems  {Labs (Optional):23779}   Objective    There were no vitals filed for this visit. There is no height or weight on file to calculate BMI.  BP Readings from Last 3 Encounters:  08/06/22 108/73  07/01/22 99/64  06/03/22 (Abnormal) 90/58    Wt Readings from Last 3 Encounters:  08/06/22 145 lb (65.8 kg)  07/01/22 145 lb 1.9 oz (65.8 kg)  06/03/22 148 lb 1.9 oz (67.2 kg)    Physical Exam  ***  No results found for any visits on 12/07/22.  Assessment & Plan    There are no diagnoses linked to this encounter.   No follow-ups on file.          Ronnell Freshwater, NP  [facility] at [facility] [phone] (phone) [phone] (fax)  [place]

## 2022-12-07 ENCOUNTER — Encounter: Payer: Self-pay | Admitting: Nurse Practitioner

## 2022-12-07 ENCOUNTER — Other Ambulatory Visit: Payer: Self-pay

## 2022-12-07 ENCOUNTER — Other Ambulatory Visit (HOSPITAL_COMMUNITY): Payer: Self-pay

## 2022-12-07 ENCOUNTER — Ambulatory Visit: Payer: 59 | Admitting: Nurse Practitioner

## 2022-12-07 DIAGNOSIS — F331 Major depressive disorder, recurrent, moderate: Secondary | ICD-10-CM | POA: Diagnosis not present

## 2022-12-07 MED ORDER — BUPROPION HCL ER (SR) 150 MG PO TB12
150.0000 mg | ORAL_TABLET | Freq: Two times a day (BID) | ORAL | 3 refills | Status: DC
  Filled 2022-12-07: qty 180, 90d supply, fill #0

## 2022-12-07 NOTE — Assessment & Plan Note (Signed)
Stable. Doing well. Continue wellbutrin SR 150 mg twice daily. Reassess in 4 months.

## 2022-12-08 ENCOUNTER — Encounter: Payer: Self-pay | Admitting: Nurse Practitioner

## 2023-01-14 ENCOUNTER — Ambulatory Visit
Admission: RE | Admit: 2023-01-14 | Discharge: 2023-01-14 | Disposition: A | Discharge status: Home / Self Care | Payer: 59 | Source: Ambulatory Visit | Attending: Obstetrics and Gynecology | Admitting: Obstetrics and Gynecology

## 2023-01-14 DIAGNOSIS — Z1231 Encounter for screening mammogram for malignant neoplasm of breast: Secondary | ICD-10-CM | POA: Diagnosis not present

## 2023-01-18 ENCOUNTER — Other Ambulatory Visit: Payer: Self-pay | Admitting: Obstetrics and Gynecology

## 2023-01-18 DIAGNOSIS — R928 Other abnormal and inconclusive findings on diagnostic imaging of breast: Secondary | ICD-10-CM

## 2023-01-26 ENCOUNTER — Other Ambulatory Visit: Payer: Self-pay | Admitting: Obstetrics and Gynecology

## 2023-01-26 ENCOUNTER — Ambulatory Visit
Admission: RE | Admit: 2023-01-26 | Discharge: 2023-01-26 | Disposition: A | Discharge status: Home / Self Care | Payer: 59 | Source: Ambulatory Visit | Attending: Obstetrics and Gynecology | Admitting: Obstetrics and Gynecology

## 2023-01-26 DIAGNOSIS — R928 Other abnormal and inconclusive findings on diagnostic imaging of breast: Secondary | ICD-10-CM

## 2023-01-26 DIAGNOSIS — N6001 Solitary cyst of right breast: Secondary | ICD-10-CM | POA: Diagnosis not present

## 2023-01-26 DIAGNOSIS — R92331 Mammographic heterogeneous density, right breast: Secondary | ICD-10-CM | POA: Diagnosis not present

## 2023-02-22 ENCOUNTER — Other Ambulatory Visit (HOSPITAL_COMMUNITY): Payer: Self-pay

## 2023-02-22 MED ORDER — ESTRADIOL 0.05 MG/24HR TD PTTW
1.0000 | MEDICATED_PATCH | TRANSDERMAL | 2 refills | Status: DC
  Filled 2023-02-22: qty 24, 84d supply, fill #0
  Filled 2023-05-22: qty 24, 84d supply, fill #1
  Filled 2023-09-03: qty 24, 84d supply, fill #2

## 2023-04-02 ENCOUNTER — Other Ambulatory Visit (HOSPITAL_COMMUNITY): Payer: Self-pay

## 2023-04-02 ENCOUNTER — Ambulatory Visit: Payer: 59 | Admitting: Family Medicine

## 2023-04-02 ENCOUNTER — Encounter: Payer: Self-pay | Admitting: Family Medicine

## 2023-04-02 VITALS — BP 97/64 | HR 83 | Ht 64.0 in | Wt 143.8 lb

## 2023-04-02 DIAGNOSIS — Z23 Encounter for immunization: Secondary | ICD-10-CM | POA: Diagnosis not present

## 2023-04-02 DIAGNOSIS — F331 Major depressive disorder, recurrent, moderate: Secondary | ICD-10-CM

## 2023-04-02 DIAGNOSIS — Z1159 Encounter for screening for other viral diseases: Secondary | ICD-10-CM | POA: Diagnosis not present

## 2023-04-02 MED ORDER — BUPROPION HCL ER (SR) 150 MG PO TB12
150.0000 mg | ORAL_TABLET | Freq: Every day | ORAL | 0 refills | Status: DC
Start: 2023-04-02 — End: 2023-07-01
  Filled 2023-04-02: qty 30, 30d supply, fill #0

## 2023-04-02 NOTE — Patient Instructions (Signed)
I misspoke, your last annual appointment with [place] was actually January 31 of 2023, so we can get you in for an annual a little sooner than next January!

## 2023-04-02 NOTE — Assessment & Plan Note (Signed)
PHQ-9 score 0, GAD-7 score 2.  Stable, at baseline.  Continue bupropion 150 mg once daily.  She has a lot of extras at this time, so she will contact the pharmacy once she is close to running out.  Will continue to monitor.

## 2023-04-02 NOTE — Progress Notes (Signed)
Established Patient Office Visit  Subjective   Patient ID: Dominique Powell, female    DOB: Feb 10, 1971  Age: 52 y.o. MRN: [id number]  Chief Complaint  Patient presents with   Medication Management    HPI Dominique Powell is a 52 y.o. female presenting today for follow up of mood. Mood: Patient is here to follow up for depression and anxiety, currently managing with bupropion once daily. Taking medication without side effects, reports excellent compliance with treatment. Denies mood changes or SI/HI. She feels mood is stable since last visit.     04/02/2023   11:14 AM 12/07/2022    9:40 AM 08/06/2022   11:22 AM  Depression screen PHQ 2/9  Decreased Interest 0 0 0  Down, Depressed, Hopeless 0 0 0  PHQ - 2 Score 0 0 0  Altered sleeping 0 1 1  Tired, decreased energy 0 0 1  Change in appetite 0 0 0  Feeling bad or failure about yourself  0 0 0  Trouble concentrating 0 0 0  Moving slowly or fidgety/restless 0 0 0  Suicidal thoughts 0 0 0  PHQ-9 Score 0 1 2  Difficult doing work/chores   Not difficult at all       04/02/2023   11:14 AM 12/07/2022    9:40 AM 08/06/2022   11:22 AM 07/01/2022    2:05 PM  GAD 7 : Generalized Anxiety Score  Nervous, Anxious, on Edge 1 1 0 0  Control/stop worrying 0 0 0 0  Worry too much - different things 0 0 0 0  Trouble relaxing 0 0 0 0  Restless  0 0 0  Easily annoyed or irritable 1 1 0 1  Afraid - awful might happen 0 0 0 0  Total GAD 7 Score  2 0 1  Anxiety Difficulty Not difficult at all  Not difficult at all    ROS Negative unless otherwise noted in HPI   Objective:     BP 97/64   Pulse 83   Ht 5\' 4"  (1.626 m)   Wt 143 lb 12 oz (65.2 kg)   SpO2 100%   BMI 24.67 kg/m   Physical Exam Constitutional:      General: She is not in acute distress.    Appearance: Normal appearance.  HENT:     Head: Normocephalic and atraumatic.  Cardiovascular:     Rate and Rhythm: Normal rate and regular rhythm.     Heart sounds: No murmur  heard.    No friction rub. No gallop.  Pulmonary:     Effort: Pulmonary effort is normal. No respiratory distress.     Breath sounds: No wheezing, rhonchi or rales.  Musculoskeletal:     Cervical back: Normal range of motion.  Skin:    General: Skin is warm and dry.  Neurological:     General: No focal deficit present.     Mental Status: She is alert and oriented to person, place, and time. Mental status is at baseline.  Psychiatric:        Mood and Affect: Mood normal.        Thought Content: Thought content normal.        Judgment: Judgment normal.      Assessment & Plan:  Moderate episode of recurrent major depressive disorder (HCC) Assessment & Plan: PHQ-9 score 0, GAD-7 score 2.  Stable, at baseline.  Continue bupropion 150 mg once daily.  She has a lot of extras at this time,  so she will contact the pharmacy once she is close to running out.  Will continue to monitor.  Orders: -     buPROPion HCl ER (SR); Take 1 tablet (150 mg total) by mouth daily.  Dispense: 30 tablet; Refill: 0  Screening for viral disease -     Hepatitis C antibody; Future -     HIV Antibody (routine testing w rflx); Future  Need for shingles vaccine -     Varicella-zoster vaccine IM  We discussed recommendations for HIV and hepatitis C screening, she is agreeable to having this done with her annual lab work.  Requesting copies of records from [facility].  Return in about 4 months (around 08/03/2023) for annual physical, fasting blood work 1 week before.    Melida Quitter, PA

## 2023-04-09 ENCOUNTER — Encounter: Payer: 59 | Admitting: Nurse Practitioner

## 2023-04-29 DIAGNOSIS — L814 Other melanin hyperpigmentation: Secondary | ICD-10-CM | POA: Diagnosis not present

## 2023-04-29 DIAGNOSIS — D225 Melanocytic nevi of trunk: Secondary | ICD-10-CM | POA: Diagnosis not present

## 2023-04-29 DIAGNOSIS — L821 Other seborrheic keratosis: Secondary | ICD-10-CM | POA: Diagnosis not present

## 2023-05-22 ENCOUNTER — Other Ambulatory Visit: Payer: Self-pay | Admitting: Family Medicine

## 2023-05-24 ENCOUNTER — Other Ambulatory Visit (HOSPITAL_COMMUNITY): Payer: Self-pay

## 2023-05-24 MED ORDER — CETIRIZINE HCL 10 MG PO TABS
10.0000 mg | ORAL_TABLET | Freq: Every day | ORAL | 0 refills | Status: AC
  Filled 2023-05-24: qty 100, 100d supply, fill #0

## 2023-06-22 DIAGNOSIS — H524 Presbyopia: Secondary | ICD-10-CM | POA: Diagnosis not present

## 2023-07-01 ENCOUNTER — Other Ambulatory Visit: Payer: Self-pay | Admitting: Family Medicine

## 2023-07-01 ENCOUNTER — Other Ambulatory Visit (HOSPITAL_COMMUNITY): Payer: Self-pay

## 2023-07-01 DIAGNOSIS — F331 Major depressive disorder, recurrent, moderate: Secondary | ICD-10-CM

## 2023-07-01 MED ORDER — BUPROPION HCL ER (SR) 150 MG PO TB12
150.0000 mg | ORAL_TABLET | Freq: Every day | ORAL | 0 refills | Status: DC
Start: 2023-07-01 — End: 2023-08-03
  Filled 2023-07-01: qty 30, 30d supply, fill #0

## 2023-08-03 ENCOUNTER — Other Ambulatory Visit: Payer: Self-pay | Admitting: Family Medicine

## 2023-08-03 DIAGNOSIS — F331 Major depressive disorder, recurrent, moderate: Secondary | ICD-10-CM

## 2023-08-04 ENCOUNTER — Other Ambulatory Visit (HOSPITAL_COMMUNITY): Payer: Self-pay

## 2023-08-04 MED ORDER — BUPROPION HCL ER (SR) 150 MG PO TB12
150.0000 mg | ORAL_TABLET | Freq: Every day | ORAL | 0 refills | Status: DC
Start: 2023-08-04 — End: 2023-11-04
  Filled 2023-08-04: qty 90, 90d supply, fill #0

## 2023-08-08 ENCOUNTER — Other Ambulatory Visit (HOSPITAL_COMMUNITY): Payer: Self-pay

## 2023-08-16 ENCOUNTER — Other Ambulatory Visit (HOSPITAL_COMMUNITY): Payer: Self-pay

## 2023-09-05 ENCOUNTER — Other Ambulatory Visit (HOSPITAL_COMMUNITY): Payer: Self-pay

## 2023-09-06 ENCOUNTER — Other Ambulatory Visit (HOSPITAL_COMMUNITY): Payer: Self-pay

## 2023-10-14 ENCOUNTER — Other Ambulatory Visit (HOSPITAL_COMMUNITY): Payer: Self-pay

## 2023-10-14 ENCOUNTER — Encounter: Payer: Self-pay | Admitting: Family Medicine

## 2023-10-14 MED ORDER — ESTRADIOL 0.05 MG/24HR TD PTWK
0.0500 mg | MEDICATED_PATCH | TRANSDERMAL | 2 refills | Status: DC
  Filled 2023-10-14: qty 4, 28d supply, fill #0

## 2023-10-14 MED ORDER — PROGESTERONE MICRONIZED 100 MG PO CAPS
100.0000 mg | ORAL_CAPSULE | Freq: Every day | ORAL | 2 refills | Status: DC
  Filled 2023-10-14: qty 30, 30d supply, fill #0
  Filled 2023-11-12 (×2): qty 30, 30d supply, fill #1
  Filled 2023-12-02 – 2023-12-06 (×3): qty 30, 30d supply, fill #2

## 2023-10-15 ENCOUNTER — Other Ambulatory Visit (HOSPITAL_COMMUNITY): Payer: Self-pay

## 2023-10-15 MED ORDER — ESTRADIOL 0.05 MG/24HR TD PTTW
1.0000 | MEDICATED_PATCH | TRANSDERMAL | 0 refills | Status: DC
  Filled 2023-10-15 – 2023-12-02 (×2): qty 24, 84d supply, fill #0

## 2023-10-18 ENCOUNTER — Encounter: Payer: 59 | Admitting: Family Medicine

## 2023-11-03 ENCOUNTER — Encounter: Payer: Self-pay | Admitting: Family Medicine

## 2023-11-03 DIAGNOSIS — F331 Major depressive disorder, recurrent, moderate: Secondary | ICD-10-CM

## 2023-11-04 ENCOUNTER — Other Ambulatory Visit (HOSPITAL_COMMUNITY): Payer: Self-pay

## 2023-11-04 MED ORDER — BUPROPION HCL ER (SR) 150 MG PO TB12
150.0000 mg | ORAL_TABLET | Freq: Every day | ORAL | 0 refills | Status: DC
  Filled 2023-11-04: qty 60, 60d supply, fill #0

## 2023-11-12 ENCOUNTER — Other Ambulatory Visit (HOSPITAL_COMMUNITY): Payer: Self-pay

## 2023-11-12 ENCOUNTER — Other Ambulatory Visit: Payer: Self-pay

## 2023-11-26 DIAGNOSIS — H2513 Age-related nuclear cataract, bilateral: Secondary | ICD-10-CM | POA: Diagnosis not present

## 2023-11-26 DIAGNOSIS — H33193 Other retinoschisis and retinal cysts, bilateral: Secondary | ICD-10-CM | POA: Diagnosis not present

## 2023-11-26 DIAGNOSIS — H43811 Vitreous degeneration, right eye: Secondary | ICD-10-CM | POA: Diagnosis not present

## 2023-12-02 ENCOUNTER — Other Ambulatory Visit (HOSPITAL_COMMUNITY): Payer: Self-pay

## 2023-12-02 ENCOUNTER — Other Ambulatory Visit: Payer: Self-pay

## 2023-12-06 ENCOUNTER — Other Ambulatory Visit (HOSPITAL_COMMUNITY): Payer: Self-pay

## 2023-12-10 ENCOUNTER — Other Ambulatory Visit (HOSPITAL_COMMUNITY): Payer: Self-pay

## 2023-12-22 ENCOUNTER — Other Ambulatory Visit (HOSPITAL_COMMUNITY): Payer: Self-pay

## 2023-12-22 DIAGNOSIS — Z113 Encounter for screening for infections with a predominantly sexual mode of transmission: Secondary | ICD-10-CM | POA: Diagnosis not present

## 2023-12-22 DIAGNOSIS — Z1331 Encounter for screening for depression: Secondary | ICD-10-CM | POA: Diagnosis not present

## 2023-12-22 DIAGNOSIS — Z01411 Encounter for gynecological examination (general) (routine) with abnormal findings: Secondary | ICD-10-CM | POA: Diagnosis not present

## 2023-12-22 DIAGNOSIS — Z30432 Encounter for removal of intrauterine contraceptive device: Secondary | ICD-10-CM | POA: Diagnosis not present

## 2023-12-22 DIAGNOSIS — Z124 Encounter for screening for malignant neoplasm of cervix: Secondary | ICD-10-CM | POA: Diagnosis not present

## 2023-12-22 DIAGNOSIS — Z01419 Encounter for gynecological examination (general) (routine) without abnormal findings: Secondary | ICD-10-CM | POA: Diagnosis not present

## 2023-12-22 MED ORDER — ESTRADIOL 0.05 MG/24HR TD PTTW
1.0000 | MEDICATED_PATCH | TRANSDERMAL | 3 refills | Status: AC
Start: 2023-12-23 — End: ?
  Filled 2023-12-22: qty 24, 84d supply, fill #0

## 2023-12-22 MED ORDER — ESTRADIOL 0.1 MG/GM VA CREA
1.0000 | TOPICAL_CREAM | VAGINAL | 3 refills | Status: AC
  Filled 2023-12-22: qty 42.5, 84d supply, fill #0

## 2023-12-22 MED ORDER — TESTOSTERONE 25 MG/2.5GM (1%) TD GEL
1.0000 | Freq: Every day | TRANSDERMAL | 3 refills | Status: DC
  Filled 2023-12-22: qty 75, 90d supply, fill #0

## 2023-12-22 MED ORDER — PROGESTERONE MICRONIZED 100 MG PO CAPS
100.0000 mg | ORAL_CAPSULE | Freq: Every day | ORAL | 3 refills | Status: AC
  Filled 2023-12-22 – 2024-01-15 (×2): qty 90, 90d supply, fill #0
  Filled 2024-04-04: qty 90, 90d supply, fill #1
  Filled 2024-05-14 – 2024-07-10 (×2): qty 90, 90d supply, fill #2

## 2023-12-27 ENCOUNTER — Other Ambulatory Visit (HOSPITAL_COMMUNITY): Payer: Self-pay

## 2023-12-27 MED ORDER — TESTOSTERONE 25 MG/2.5GM (1%) TD GEL
1.0000 | Freq: Every day | TRANSDERMAL | 1 refills | Status: DC
  Filled 2023-12-27: qty 75, 30d supply, fill #0

## 2023-12-28 ENCOUNTER — Other Ambulatory Visit (HOSPITAL_COMMUNITY): Payer: Self-pay

## 2023-12-28 ENCOUNTER — Encounter (HOSPITAL_COMMUNITY): Payer: Self-pay

## 2023-12-29 ENCOUNTER — Other Ambulatory Visit (HOSPITAL_COMMUNITY): Payer: Self-pay

## 2023-12-31 ENCOUNTER — Other Ambulatory Visit (HOSPITAL_COMMUNITY): Payer: Self-pay

## 2023-12-31 ENCOUNTER — Ambulatory Visit: Payer: 59 | Admitting: Family Medicine

## 2023-12-31 ENCOUNTER — Encounter: Payer: Self-pay | Admitting: Family Medicine

## 2023-12-31 VITALS — BP 112/78 | HR 70 | Temp 97.3°F | Ht 64.0 in | Wt 142.0 lb

## 2023-12-31 DIAGNOSIS — B001 Herpesviral vesicular dermatitis: Secondary | ICD-10-CM | POA: Diagnosis not present

## 2023-12-31 DIAGNOSIS — E782 Mixed hyperlipidemia: Secondary | ICD-10-CM | POA: Diagnosis not present

## 2023-12-31 DIAGNOSIS — R232 Flushing: Secondary | ICD-10-CM | POA: Diagnosis not present

## 2023-12-31 DIAGNOSIS — F32A Depression, unspecified: Secondary | ICD-10-CM

## 2023-12-31 DIAGNOSIS — F419 Anxiety disorder, unspecified: Secondary | ICD-10-CM

## 2023-12-31 LAB — COMPREHENSIVE METABOLIC PANEL WITH GFR
ALT: 19 U/L (ref 0–35)
AST: 18 U/L (ref 0–37)
Albumin: 5 g/dL (ref 3.5–5.2)
Alkaline Phosphatase: 59 U/L (ref 39–117)
BUN: 15 mg/dL (ref 6–23)
CO2: 32 meq/L (ref 19–32)
Calcium: 9.8 mg/dL (ref 8.4–10.5)
Chloride: 101 meq/L (ref 96–112)
Creatinine, Ser: 0.77 mg/dL (ref 0.40–1.20)
GFR: 88.03 mL/min (ref >60.00)
Glucose, Bld: 90 mg/dL (ref 70–99)
Potassium: 4.6 meq/L (ref 3.5–5.1)
Sodium: 138 meq/L (ref 135–145)
Total Bilirubin: 1 mg/dL (ref 0.2–1.2)
Total Protein: 7.6 g/dL (ref 6.0–8.3)

## 2023-12-31 LAB — LIPID PANEL
Cholesterol: 256 mg/dL — ABNORMAL HIGH (ref 0–200)
HDL: 84.4 mg/dL (ref >39.00)
LDL Cholesterol: 162 mg/dL — ABNORMAL HIGH (ref 0–99)
NonHDL: 171.2
Total CHOL/HDL Ratio: 3
Triglycerides: 44 mg/dL (ref 0.0–149.0)
VLDL: 8.8 mg/dL (ref 0.0–40.0)

## 2023-12-31 LAB — CBC WITH DIFFERENTIAL/PLATELET
Basophils Absolute: 0.1 10*3/uL (ref 0.0–0.1)
Basophils Relative: 1.2 % (ref 0.0–3.0)
Eosinophils Absolute: 0.2 10*3/uL (ref 0.0–0.7)
Eosinophils Relative: 4.9 % (ref 0.0–5.0)
HCT: 39.9 % (ref 36.0–46.0)
Hemoglobin: 13.7 g/dL (ref 12.0–15.0)
Lymphocytes Relative: 38.9 % (ref 12.0–46.0)
Lymphs Abs: 1.7 10*3/uL (ref 0.7–4.0)
MCHC: 34.3 g/dL (ref 30.0–36.0)
MCV: 91.5 fl (ref 78.0–100.0)
Monocytes Absolute: 0.5 10*3/uL (ref 0.1–1.0)
Monocytes Relative: 10.9 % (ref 3.0–12.0)
Neutro Abs: 2 10*3/uL (ref 1.4–7.7)
Neutrophils Relative %: 44.1 % (ref 43.0–77.0)
Platelets: 328 10*3/uL (ref 150.0–400.0)
RBC: 4.36 Mil/uL (ref 3.87–5.11)
RDW: 12.5 % (ref 11.5–15.5)
WBC: 4.4 10*3/uL (ref 4.0–10.5)

## 2023-12-31 LAB — T4, FREE: Free T4: 0.79 ng/dL (ref 0.60–1.60)

## 2023-12-31 LAB — TSH: TSH: 2.17 u[IU]/mL (ref 0.35–5.50)

## 2023-12-31 MED ORDER — VALACYCLOVIR HCL 1 G PO TABS
2000.0000 mg | ORAL_TABLET | Freq: Two times a day (BID) | ORAL | 1 refills | Status: AC
  Filled 2023-12-31: qty 30, 8d supply, fill #0
  Filled 2024-09-03: qty 30, 8d supply, fill #1

## 2023-12-31 MED ORDER — BUPROPION HCL ER (SR) 150 MG PO TB12
150.0000 mg | ORAL_TABLET | Freq: Every day | ORAL | 1 refills | Status: DC
  Filled 2023-12-31: qty 90, 90d supply, fill #0
  Filled 2024-05-14: qty 90, 90d supply, fill #1

## 2023-12-31 NOTE — Patient Instructions (Signed)
 Thank you for trusting us  with your health care.  Please go downstairs for labs before you leave.  Will be in touch with your results and with recommendations.  Let me know if you would like for me to order the CT coronary calcium test

## 2023-12-31 NOTE — Progress Notes (Signed)
 New Patient Office Visit  Subjective    Patient ID: Dominique Powell, female    DOB: 1971/08/20  Age: 53 y.o. MRN: [id number]  CC:  Chief Complaint  Patient presents with   Establish Care    Needs PCP, no concern     HPI Dominique Powell presents to establish care OB/GYN - Richard Champion at [facility]   Menopausal  IUD removed   On estradiol  patch, vaginal Estrace  and oral progesterone    HLD- total chol 240 HDL - 84 LDL- 149   Anxiety and depression- on Wellbutrin   She stopped lexapro    Decreased libido - prescribed testosterone  by OB/GYN but has not yet started medication   Children  Married  Clinical nurse specialist at [employer]   Shingles vaccine - had 2 shots   Outpatient Encounter Medications as of 12/31/2023  Medication Sig   cetirizine  (ZYRTEC ) 10 MG tablet Take 1 tablet (10 mg total) by mouth daily.   estradiol  (ESTRACE ) 0.1 MG/GM vaginal cream Insert 1 applicatorful twice a week by vaginal route at bedtime.   progesterone  (PROMETRIUM ) 100 MG capsule Take 1 capsule (100 mg total) by mouth daily.   valACYclovir  (VALTREX ) 1000 MG tablet Take 2 tablets (2,000 mg total) by mouth 2 (two) times daily x 1 day at onset of cold sore.   [DISCONTINUED] buPROPion  (WELLBUTRIN  SR) 150 MG 12 hr tablet Take 1 tablet (150 mg total) by mouth daily.   [DISCONTINUED] estradiol  (VIVELLE -DOT) 0.05 MG/24HR patch Place 1 patch (0.05 mg total) onto the skin 2 (two) times a week.   [DISCONTINUED] estradiol  (VIVELLE -DOT) 0.05 MG/24HR patch Place 1 patch (0.05 mg total) onto the skin 2 (two) times a week.   buPROPion  (WELLBUTRIN  SR) 150 MG 12 hr tablet Take 1 tablet (150 mg total) by mouth daily.   estradiol  (VIVELLE -DOT) 0.05 MG/24HR patch Place 1 patch (0.05 mg total) onto the skin 2 (two) times a week.   Testosterone  25 MG/2.5GM (1%) GEL Apply 1/3 packet to clean, dry, intact skin of the shoulders, upper arms, or abdomen daily (Patient not taking: Reported on 12/31/2023)   Testosterone  25  MG/2.5GM (1%) GEL Place 1 packet onto the skin daily. (Patient not taking: Reported on 12/31/2023)   [DISCONTINUED] valACYclovir  (VALTREX ) 1000 MG tablet Take 1 tablet (1,000 mg total) by mouth every 12 (twelve) hours as needed for 3 days (Patient not taking: Reported on 12/31/2023)   [DISCONTINUED] levonorgestrel  (MIRENA ) 20 MCG/24HR IUD    No facility-administered encounter medications on file as of 12/31/2023.    Past Medical History:  Diagnosis Date   Allergy    Anxiety and depression    Endometriosis    Hyperlipidemia    no meds    Infertility    Kidney stone    Retinoschisis     Past Surgical History:  Procedure Laterality Date   AUGMENTATION MAMMAPLASTY Bilateral    INTRAUTERINE DEVICE INSERTION  04/23/2017   PELVIC LAPAROSCOPY     Endometriosis   tongue growth removed      age 82    TYMPANOSTOMY TUBE PLACEMENT     x3   WISDOM TOOTH EXTRACTION      Family History  Problem Relation Age of Onset   Hypertension Maternal Grandmother    Rheumatic fever Maternal Grandmother    Hypertension Paternal Grandfather    Heart attack Paternal Grandmother 87   Kidney disease Father    Hypertension Father    Hyperlipidemia Father    Breast cancer Neg Hx  Colon cancer Neg Hx    Colon polyps Neg Hx    Esophageal cancer Neg Hx    Rectal cancer Neg Hx    Stomach cancer Neg Hx     Social History   Socioeconomic History   Marital status: Married    Spouse name: Not on file   Number of children: Not on file   Years of education: Not on file   Highest education level: Master's degree (e.g., MA, MS, MEng, MEd, MSW, MBA)  Occupational History   Not on file  Tobacco Use   Smoking status: Never   Smokeless tobacco: Never  Vaping Use   Vaping status: Never Used  Substance and Sexual Activity   Alcohol use: Yes    Comment: social    Drug use: No   Sexual activity: Yes    Partners: Male    Birth control/protection: I.U.D.    Comment: 1st intercourse- 16, partners-5,   married- 25 yrs   Other Topics Concern   Not on file  Social History Narrative   Not on file   Social Drivers of Health   Financial Resource Strain: Low Risk  (12/30/2023)   Overall Financial Resource Strain (CARDIA)    Difficulty of Paying Living Expenses: Not hard at all  Food Insecurity: No Food Insecurity (12/30/2023)   Hunger Vital Sign    Worried About Running Out of Food in the Last Year: Never true    Ran Out of Food in the Last Year: Never true  Transportation Needs: No Transportation Needs (12/30/2023)   PRAPARE - Administrator, Civil Service (Medical): No    Lack of Transportation (Non-Medical): No  Physical Activity: Sufficiently Active (12/30/2023)   Exercise Vital Sign    Days of Exercise per Week: 3 days    Minutes of Exercise per Session: 90 min  Stress: Stress Concern Present (12/30/2023)   [employer] of Occupational Health - Occupational Stress Questionnaire    Feeling of Stress : To some extent  Social Connections: Socially Integrated (12/30/2023)   Social Connection and Isolation Panel [NHANES]    Frequency of Communication with Friends and Family: More than three times a week    Frequency of Social Gatherings with Friends and Family: Once a week    Attends Religious Services: More than 4 times per year    Active Member of [employer] or Organizations: Yes    Attends Engineer, structural: More than 4 times per year    Marital Status: Married  Catering manager Violence: Not on file    Review of Systems  Constitutional:  Negative for chills and fever.       Hot flashes   Respiratory:  Negative for shortness of breath.   Cardiovascular:  Negative for chest pain, palpitations and leg swelling.  Gastrointestinal:  Negative for abdominal pain, constipation, diarrhea, nausea and vomiting.  Neurological:  Negative for dizziness and focal weakness.        Objective    BP 112/78 (BP Location: Left Arm, Patient Position: Sitting)   Pulse 70    Temp (!) 97.3 F (36.3 C) (Temporal)   Ht 5\' 4"  (1.626 m)   Wt 142 lb (64.4 kg)   SpO2 99%   BMI 24.37 kg/m   Physical Exam Constitutional:      General: She is not in acute distress.    Appearance: She is not ill-appearing.  Eyes:     Extraocular Movements: Extraocular movements intact.     Conjunctiva/sclera: Conjunctivae  normal.  Cardiovascular:     Rate and Rhythm: Normal rate.  Pulmonary:     Effort: Pulmonary effort is normal.  Musculoskeletal:     Cervical Powell: Normal range of motion and neck supple.     Right lower leg: No edema.     Left lower leg: No edema.  Skin:    General: Skin is warm and dry.  Neurological:     General: No focal deficit present.     Mental Status: She is alert and oriented to person, place, and time.     Motor: No weakness.     Coordination: Coordination normal.     Gait: Gait normal.  Psychiatric:        Mood and Affect: Mood normal.        Behavior: Behavior normal.        Thought Content: Thought content normal.         Assessment & Plan:   Problem List Items Addressed This Visit     Anxiety and depression - Primary   Relevant Medications   buPROPion  (WELLBUTRIN  SR) 150 MG 12 hr tablet   Herpes labialis   Relevant Medications   valACYclovir  (VALTREX ) 1000 MG tablet   Hot flashes   Relevant Orders   CBC with Differential/Platelet (Completed)   Comprehensive metabolic panel with GFR (Completed)   TSH (Completed)   T4, free (Completed)   Mixed hyperlipidemia   Relevant Orders   Lipid panel (Completed)   She is a pleasant 53 yo female here to re-establish care.  Mood is good on Wellbutrin .  Counseling on risk factors for heart disease.  ASCVD 1.1%  Discussed CT Coronary Calcium Scan. She will let me know. Counseling on low fat, low cholesterol diet and exercise.  Take Valtrex  prn cold cores.  Check labs and follow up.   Return in about 6 months (around 07/01/2024).   Dominique Back, NP-C

## 2024-01-02 ENCOUNTER — Encounter: Payer: Self-pay | Admitting: Family Medicine

## 2024-01-03 ENCOUNTER — Other Ambulatory Visit (HOSPITAL_COMMUNITY): Payer: Self-pay

## 2024-01-16 ENCOUNTER — Other Ambulatory Visit (HOSPITAL_COMMUNITY): Payer: Self-pay

## 2024-01-16 ENCOUNTER — Other Ambulatory Visit: Payer: Self-pay

## 2024-01-17 ENCOUNTER — Other Ambulatory Visit: Payer: Self-pay | Admitting: Family Medicine

## 2024-01-17 ENCOUNTER — Ambulatory Visit
Admission: RE | Admit: 2024-01-17 | Discharge: 2024-01-17 | Disposition: A | Discharge status: Home / Self Care | Source: Ambulatory Visit | Attending: Family Medicine | Admitting: Family Medicine

## 2024-01-17 DIAGNOSIS — Z1231 Encounter for screening mammogram for malignant neoplasm of breast: Secondary | ICD-10-CM

## 2024-03-03 DIAGNOSIS — R6882 Decreased libido: Secondary | ICD-10-CM | POA: Diagnosis not present

## 2024-03-03 DIAGNOSIS — Z780 Asymptomatic menopausal state: Secondary | ICD-10-CM | POA: Diagnosis not present

## 2024-03-07 ENCOUNTER — Other Ambulatory Visit (HOSPITAL_COMMUNITY): Payer: Self-pay

## 2024-03-07 MED ORDER — ESTRADIOL 0.1 MG/24HR TD PTTW
1.0000 | MEDICATED_PATCH | TRANSDERMAL | 3 refills | Status: AC
  Filled 2024-03-07: qty 8, 28d supply, fill #0
  Filled 2024-05-14: qty 8, 28d supply, fill #1
  Filled 2024-06-22: qty 8, 28d supply, fill #2
  Filled 2024-07-10 – 2024-07-18 (×2): qty 8, 28d supply, fill #3

## 2024-04-04 ENCOUNTER — Other Ambulatory Visit (HOSPITAL_COMMUNITY): Payer: Self-pay

## 2024-04-06 ENCOUNTER — Other Ambulatory Visit (HOSPITAL_COMMUNITY): Payer: Self-pay

## 2024-05-15 ENCOUNTER — Other Ambulatory Visit: Payer: Self-pay

## 2024-06-26 ENCOUNTER — Other Ambulatory Visit: Payer: Self-pay

## 2024-07-04 ENCOUNTER — Encounter: Payer: Self-pay | Admitting: Family Medicine

## 2024-07-04 ENCOUNTER — Ambulatory Visit: Admitting: Family Medicine

## 2024-07-04 VITALS — BP 122/72 | HR 86 | Temp 97.6°F | Ht 64.0 in | Wt 146.0 lb

## 2024-07-04 DIAGNOSIS — E7800 Pure hypercholesterolemia, unspecified: Secondary | ICD-10-CM

## 2024-07-04 NOTE — Progress Notes (Signed)
 Subjective:     Patient ID: Dominique Powell, female    DOB: 1971/08/31, 53 y.o.   MRN: [id number]  Chief Complaint  Patient presents with   Medical Management of Chronic Issues    6 month f/u     HPI  Discussed the use of AI scribe software for clinical note transcription with the patient, who gave verbal consent to proceed.  History of Present Illness Dominique Powell July is a 53 year old female who presents for a six-month follow-up on chronic health conditions.  Dyslipidemia - Consistently elevated LDL cholesterol, most recently 162 mg/dL - Not currently on lipid-lowering medication - HDL cholesterol is elevated - Maintains a clean diet and exercises regularly, though consistency is a challenge - No family history of early coronary artery disease, myocardial infarction, or cerebrovascular accident  Perimenopausal symptoms - Light and irregular menstrual cycles occurring every two weeks, lasting 10-12 days, following IUD removal - Uses an estrogen with recent dose increase - Reduction in palpitations since increasing estrogen patch dose - No current symptoms of dizziness, near-syncope, chest pain, or palpitations  Gynecologic history - History of endometriosis and fertility issues - No significant dysmenorrhea currently     Health Maintenance Due  Topic Date Due   HIV Screening  Never done   Hepatitis C Screening  Never done   DTaP/Tdap/Td (1 - Tdap) Never done   Hepatitis Powell Vaccines 19-59 Average Risk (1 of 3 - 19+ 3-dose series) Never done   Cervical Cancer Screening (Pap smear)  08/16/2019   Pneumococcal Vaccine: 50+ Years (1 of 1 - PCV) Never done    Past Medical History:  Diagnosis Date   Allergy    Anxiety and depression    Endometriosis    Hyperlipidemia    no meds    Infertility    Kidney stone    Retinoschisis     Past Surgical History:  Procedure Laterality Date   AUGMENTATION MAMMAPLASTY Bilateral    INTRAUTERINE DEVICE INSERTION  04/23/2017    PELVIC LAPAROSCOPY     Endometriosis   tongue growth removed      age 1    TYMPANOSTOMY TUBE PLACEMENT     x3   WISDOM TOOTH EXTRACTION      Family History  Problem Relation Age of Onset   Hypertension Maternal Grandmother    Rheumatic fever Maternal Grandmother    Hypertension Paternal Grandfather    Heart attack Paternal Grandmother 52   Kidney disease Father    Hypertension Father    Hyperlipidemia Father    Breast cancer Neg Hx    Colon cancer Neg Hx    Colon polyps Neg Hx    Esophageal cancer Neg Hx    Rectal cancer Neg Hx    Stomach cancer Neg Hx     Social History   Socioeconomic History   Marital status: Married    Spouse name: Not on file   Number of children: Not on file   Years of education: Not on file   Highest education level: Master's degree (e.g., MA, MS, MEng, MEd, MSW, MBA)  Occupational History   Not on file  Tobacco Use   Smoking status: Never   Smokeless tobacco: Never  Vaping Use   Vaping status: Never Used  Substance and Sexual Activity   Alcohol use: Yes    Comment: social    Drug use: No   Sexual activity: Yes    Partners: Male    Birth control/protection: I.U.D.  Comment: 1st intercourse- 16, partners-5,  married- 25 yrs   Other Topics Concern   Not on file  Social History Narrative   Not on file   Social Drivers of Health   Financial Resource Strain: Low Risk  (12/30/2023)   Overall Financial Resource Strain (CARDIA)    Difficulty of Paying Living Expenses: Not hard at all  Food Insecurity: No Food Insecurity (12/30/2023)   Hunger Vital Sign    Worried About Running Out of Food in the Last Year: Never true    Ran Out of Food in the Last Year: Never true  Transportation Needs: No Transportation Needs (12/30/2023)   PRAPARE - Administrator, Civil Service (Medical): No    Lack of Transportation (Non-Medical): No  Physical Activity: Sufficiently Active (12/30/2023)   Exercise Vital Sign    Days of Exercise per  Week: 3 days    Minutes of Exercise per Session: 90 min  Stress: Stress Concern Present (12/30/2023)   [employer] of Occupational Health - Occupational Stress Questionnaire    Feeling of Stress : To some extent  Social Connections: Socially Integrated (12/30/2023)   Social Connection and Isolation Panel    Frequency of Communication with Friends and Family: More than three times a week    Frequency of Social Gatherings with Friends and Family: Once a week    Attends Religious Services: More than 4 times per year    Active Member of [employer] or Organizations: Yes    Attends Engineer, Structural: More than 4 times per year    Marital Status: Married  Catering Manager Violence: Not on file    Outpatient Medications Prior to Visit  Medication Sig Dispense Refill   buPROPion  (WELLBUTRIN  SR) 150 MG 12 hr tablet Take 1 tablet (150 mg total) by mouth daily. 90 tablet 1   cetirizine  (ZYRTEC ) 10 MG tablet Take 1 tablet (10 mg total) by mouth daily. 100 tablet 0   estradiol  (ESTRACE ) 0.1 MG/GM vaginal cream Insert 1 applicatorful twice a week by vaginal route at bedtime. 42.5 g 3   estradiol  (VIVELLE -DOT) 0.05 MG/24HR patch Place 1 patch (0.05 mg total) onto the skin 2 (two) times a week. 24 patch 3   estradiol  (VIVELLE -DOT) 0.1 MG/24HR patch Place 1 patch (0.1 mg total) onto the skin twice a week. 8 patch 3   progesterone  (PROMETRIUM ) 100 MG capsule Take 1 capsule (100 mg total) by mouth daily. 90 capsule 3   valACYclovir  (VALTREX ) 1000 MG tablet Take 2 tablets (2,000 mg total) by mouth 2 (two) times daily x 1 day at onset of cold sore. 30 tablet 1   Testosterone  25 MG/2.5GM (1%) GEL Apply 1/3 packet to clean, dry, intact skin of the shoulders, upper arms, or abdomen daily (Patient not taking: Reported on 07/04/2024) 75 g 3   No facility-administered medications prior to visit.    No Known Allergies  Review of Systems  Constitutional:  Negative for chills, fever and malaise/fatigue.   Eyes:  Negative for blurred vision and double vision.  Respiratory:  Negative for cough and shortness of breath.   Cardiovascular:  Negative for chest pain, palpitations and leg swelling.  Gastrointestinal:  Negative for diarrhea, nausea and vomiting.  Musculoskeletal:  Negative for myalgias.  Neurological:  Negative for dizziness, focal weakness and headaches.  Psychiatric/Behavioral:  Negative for depression. The patient is not nervous/anxious.        Objective:    Physical Exam Constitutional:      General:  She is not in acute distress.    Appearance: She is not ill-appearing.  Eyes:     Extraocular Movements: Extraocular movements intact.     Conjunctiva/sclera: Conjunctivae normal.  Cardiovascular:     Rate and Rhythm: Normal rate.  Pulmonary:     Effort: Pulmonary effort is normal.  Musculoskeletal:     Cervical back: Normal range of motion and neck supple.  Skin:    General: Skin is warm and dry.  Neurological:     General: No focal deficit present.     Mental Status: She is alert and oriented to person, place, and time.  Psychiatric:        Mood and Affect: Mood normal.        Behavior: Behavior normal.        Thought Content: Thought content normal.      BP 122/72   Pulse 86   Temp 97.6 F (36.4 C) (Temporal)   Ht 5' 4 (1.626 m)   Wt 146 lb (66.2 kg)   SpO2 98%   BMI 25.06 kg/m  Wt Readings from Last 3 Encounters:  07/04/24 146 lb (66.2 kg)  12/31/23 142 lb (64.4 kg)  04/02/23 143 lb 12 oz (65.2 kg)       Assessment & Plan:   Problem List Items Addressed This Visit   None Visit Diagnoses       Pure hypercholesterolemia    -  Primary   Relevant Orders   Lipid panel   Lipoprotein A (LPA)       Assessment and Plan Assessment & Plan Pure hypercholesterolemia Elevated LDL cholesterol at 162 mg/dL with a low ASCVD risk score of 1.1%. No family history of early coronary artery disease or familial hyperlipidemia. HDL cholesterol is high,  maintaining a good cholesterol ratio. No current medication for hyperlipidemia. Discussed potential benefits of statin therapy if lipoprotein A is elevated. - Ordered fasting lipid panel and lipoprotein A test. - Advised fasting and hydration before lab tests. - Will consider CT coronary calcium scan if lipoprotein A is elevated. - Will refer to cardiology for further evaluation if indicated.  Perimenopausal symptoms Irregular menstrual cycles with light bleeding. Previous palpitations resolved with increased estrogen patch dosage. No current cramps or significant symptoms.  - See gynecologist who specialized in menopause      I have discontinued Dannae Powell. Marucci's Testosterone . I am also having her maintain her cetirizine , estradiol , estradiol , progesterone , buPROPion , valACYclovir , and estradiol .  No orders of the defined types were placed in this encounter.

## 2024-07-10 ENCOUNTER — Other Ambulatory Visit: Payer: Self-pay

## 2024-07-11 ENCOUNTER — Other Ambulatory Visit: Payer: Self-pay

## 2024-07-18 ENCOUNTER — Other Ambulatory Visit (INDEPENDENT_AMBULATORY_CARE_PROVIDER_SITE_OTHER)

## 2024-07-18 DIAGNOSIS — E7800 Pure hypercholesterolemia, unspecified: Secondary | ICD-10-CM

## 2024-07-18 LAB — LIPID PANEL
Cholesterol: 229 mg/dL — ABNORMAL HIGH (ref 0–200)
HDL: 85.9 mg/dL (ref >39.00)
LDL Cholesterol: 132 mg/dL — ABNORMAL HIGH (ref 0–99)
NonHDL: 143.34
Total CHOL/HDL Ratio: 3
Triglycerides: 59 mg/dL (ref 0.0–149.0)
VLDL: 11.8 mg/dL (ref 0.0–40.0)

## 2024-07-21 ENCOUNTER — Ambulatory Visit: Payer: Self-pay | Admitting: Family Medicine

## 2024-07-21 LAB — LIPOPROTEIN A (LPA): Lipoprotein (a): 275 nmol/L — ABNORMAL HIGH (ref <75)

## 2024-07-28 ENCOUNTER — Ambulatory Visit: Admitting: Family Medicine

## 2024-07-28 VITALS — BP 112/78 | HR 86 | Temp 97.8°F | Ht 64.0 in | Wt 148.0 lb

## 2024-07-28 DIAGNOSIS — E7800 Pure hypercholesterolemia, unspecified: Secondary | ICD-10-CM

## 2024-07-28 DIAGNOSIS — Z0000 Encounter for general adult medical examination without abnormal findings: Secondary | ICD-10-CM | POA: Diagnosis not present

## 2024-07-28 DIAGNOSIS — Z0001 Encounter for general adult medical examination with abnormal findings: Secondary | ICD-10-CM

## 2024-07-28 DIAGNOSIS — E7841 Elevated Lipoprotein(a): Secondary | ICD-10-CM

## 2024-07-28 DIAGNOSIS — R9431 Abnormal electrocardiogram [ECG] [EKG]: Secondary | ICD-10-CM | POA: Diagnosis not present

## 2024-07-28 DIAGNOSIS — Z1159 Encounter for screening for other viral diseases: Secondary | ICD-10-CM

## 2024-07-28 NOTE — Progress Notes (Signed)
 Complete physical exam  Patient: Dominique Powell   DOB: 1970-11-02   53 y.o. Female  MRN: [id number]  Subjective:    Chief Complaint  Patient presents with   Annual Exam   She is here for a complete physical exam.   Discussed the use of AI scribe software for clinical note transcription with the patient, who gave verbal consent to proceed.  History of Present Illness Dominique Powell is a 53 year old female who presents for her annual preventive health care physical exam.  Cardiovascular risk assessment - Concern regarding elevated lipoprotein A levels - Family history of hypercholesterolemia: grandparents with high cholesterol, father on cholesterol medication at age 5 - Previous EKG performed for heart palpitations attributed to excessive Sudafed use - No current chest pain, palpitations, or shortness of breath - No history of hypertension or diabetes  Preventive health maintenance - Pap smear and mammogram are up to date - Colonoscopy performed in 2021 with ten-year follow-up recommended  Lifestyle modification - Motivated to focus on lifestyle changes, including diet and exercise  Medication use - Wellbutrin  taken daily - Valacyclovir  used as needed for outbreaks - Zyrtec  used as needed       Health Maintenance  Topic Date Due   HIV Screening  Never done   Hepatitis C Screening  Never done   DTaP/Tdap/Td vaccine (1 - Tdap) Never done   Hepatitis B Vaccine (1 of 3 - 19+ 3-dose series) Never done   Pap Smear  08/16/2019   Pneumococcal Vaccine for age over 70 (1 of 1 - PCV) Never done   COVID-19 Vaccine (5 - 2025-26 season) 08/13/2024*   Flu Shot  12/05/2024*   Breast Cancer Screening  01/16/2026   Colon Cancer Screening  07/25/2030   Zoster (Shingles) Vaccine  Completed   HPV Vaccine  Aged Out   Meningitis B Vaccine  Aged Out  *Topic was postponed. The date shown is not the original due date.    Wears seatbelt always, uses sunscreen, smoke detectors  in home and functioning, does not text while driving, feels safe in home environment.  Depression screening:    07/04/2024   11:29 AM 12/31/2023   10:10 AM 04/02/2023   11:14 AM  Depression screen PHQ 2/9  Decreased Interest 0 1 0  Down, Depressed, Hopeless 0 1 0  PHQ - 2 Score 0 2 0  Altered sleeping 1 1 0  Tired, decreased energy 0 1 0  Change in appetite 0 0 0  Feeling bad or failure about yourself  0 0 0  Trouble concentrating 0 0 0  Moving slowly or fidgety/restless 0 0 0  Suicidal thoughts 0 0 0  PHQ-9 Score 1  4  0   Difficult doing work/chores  Somewhat difficult      Data saved with a previous flowsheet row definition   Anxiety Screening:    12/31/2023   10:11 AM 04/02/2023   11:14 AM 12/07/2022    9:40 AM 08/06/2022   11:22 AM  GAD 7 : Generalized Anxiety Score  Nervous, Anxious, on Edge 1 1 1  0  Control/stop worrying 0 0 0 0  Worry too much - different things 1 0 0 0  Trouble relaxing 1 0 0 0  Restless 0  0 0  Easily annoyed or irritable 1 1 1  0  Afraid - awful might happen 0 0 0 0  Total GAD 7 Score 4  2 0  Anxiety Difficulty Somewhat difficult Not difficult  at all  Not difficult at all    Vision:Within last year and Dental: No current dental problems and Receives regular dental care  Patient Active Problem List   Diagnosis Date Noted   Hot flashes 12/31/2023   Anxiety and depression 12/31/2023   Herpes labialis 12/31/2023   Loud snoring 06/03/2022   Mixed hyperlipidemia 12/29/2019   IUD (intrauterine device) in place 08/11/2017   Moderate episode of recurrent major depressive disorder (HCC) 07/24/2015   Retinoschisis    Endometriosis 09/07/2001   Allergic rhinitis 09/07/1988   Past Medical History:  Diagnosis Date   Allergy    Anxiety and depression    Endometriosis    Hyperlipidemia    no meds    Infertility    Kidney stone    Retinoschisis    Past Surgical History:  Procedure Laterality Date   AUGMENTATION MAMMAPLASTY Bilateral     INTRAUTERINE DEVICE INSERTION  04/23/2017   PELVIC LAPAROSCOPY     Endometriosis   tongue growth removed      age 41    TYMPANOSTOMY TUBE PLACEMENT     x3   WISDOM TOOTH EXTRACTION     Social History   Tobacco Use   Smoking status: Never   Smokeless tobacco: Never  Vaping Use   Vaping status: Never Used  Substance Use Topics   Alcohol use: Yes    Comment: social    Drug use: No      Patient Care Team: [place], Boby CROME, NP-C as PCP - General (Family Medicine) Obgyn, [facility]   Outpatient Medications Prior to Visit  Medication Sig   buPROPion  (WELLBUTRIN  SR) 150 MG 12 hr tablet Take 1 tablet (150 mg total) by mouth daily.   cetirizine  (ZYRTEC ) 10 MG tablet Take 1 tablet (10 mg total) by mouth daily.   estradiol  (ESTRACE ) 0.1 MG/GM vaginal cream Insert 1 applicatorful twice a week by vaginal route at bedtime.   estradiol  (VIVELLE -DOT) 0.05 MG/24HR patch Place 1 patch (0.05 mg total) onto the skin 2 (two) times a week.   estradiol  (VIVELLE -DOT) 0.1 MG/24HR patch Place 1 patch (0.1 mg total) onto the skin twice a week.   progesterone  (PROMETRIUM ) 100 MG capsule Take 1 capsule (100 mg total) by mouth daily.   valACYclovir  (VALTREX ) 1000 MG tablet Take 2 tablets (2,000 mg total) by mouth 2 (two) times daily x 1 day at onset of cold sore.   No facility-administered medications prior to visit.    Review of Systems  Constitutional:  Negative for chills and fever.  HENT:  Negative for congestion, ear pain, sinus pain and sore throat.   Eyes:  Negative for blurred vision, double vision and pain.  Respiratory:  Negative for cough, shortness of breath and wheezing.   Cardiovascular:  Negative for chest pain, palpitations and leg swelling.  Gastrointestinal:  Negative for abdominal pain, constipation, diarrhea, nausea and vomiting.  Genitourinary:  Negative for dysuria, frequency and urgency.  Musculoskeletal:  Negative for back pain, joint pain and myalgias.  Skin:  Negative for  rash.  Neurological:  Negative for dizziness, tingling, focal weakness and headaches.  Endo/Heme/Allergies:  Does not bruise/bleed easily.  Psychiatric/Behavioral:  Negative for depression, memory loss and suicidal ideas. The patient is not nervous/anxious.        Objective:    BP 112/78 (BP Location: Left Arm, Patient Position: Sitting, Cuff Size: Normal)   Pulse 86   Temp 97.8 F (36.6 C) (Temporal)   Ht 5' 4 (1.626 m)   Wt  148 lb (67.1 kg)   SpO2 98%   BMI 25.40 kg/m  BP Readings from Last 3 Encounters:  07/28/24 112/78  07/04/24 122/72  12/31/23 112/78   Wt Readings from Last 3 Encounters:  07/28/24 148 lb (67.1 kg)  07/04/24 146 lb (66.2 kg)  12/31/23 142 lb (64.4 kg)    Physical Exam Constitutional:      General: She is not in acute distress.    Appearance: She is not ill-appearing.  HENT:     Right Ear: Tympanic membrane, ear canal and external ear normal.     Left Ear: Tympanic membrane, ear canal and external ear normal.     Nose: Nose normal.     Mouth/Throat:     Mouth: Mucous membranes are moist.     Pharynx: Oropharynx is clear.  Eyes:     Extraocular Movements: Extraocular movements intact.     Conjunctiva/sclera: Conjunctivae normal.     Pupils: Pupils are equal, round, and reactive to light.  Neck:     Thyroid : No thyroid  mass, thyromegaly or thyroid  tenderness.  Cardiovascular:     Rate and Rhythm: Normal rate and regular rhythm.     Pulses: Normal pulses.     Heart sounds: Normal heart sounds.  Pulmonary:     Effort: Pulmonary effort is normal.     Breath sounds: Normal breath sounds.  Abdominal:     General: Bowel sounds are normal.     Palpations: Abdomen is soft.     Tenderness: There is no abdominal tenderness. There is no right CVA tenderness, left CVA tenderness, guarding or rebound.  Musculoskeletal:        General: Normal range of motion.     Cervical back: Normal range of motion and neck supple. No tenderness.     Right lower  leg: No edema.     Left lower leg: No edema.  Lymphadenopathy:     Cervical: No cervical adenopathy.  Skin:    General: Skin is warm and dry.     Findings: No lesion or rash.  Neurological:     General: No focal deficit present.     Mental Status: She is alert and oriented to person, place, and time.     Cranial Nerves: No cranial nerve deficit.     Sensory: No sensory deficit.     Motor: No weakness.     Gait: Gait normal.  Psychiatric:        Mood and Affect: Mood normal.        Behavior: Behavior normal.        Thought Content: Thought content normal.      No results found for any visits on 07/28/24.    Assessment & Plan:    Routine Health Maintenance and Physical Exam Problem List Items Addressed This Visit   None Visit Diagnoses       Encounter for general adult medical examination with abnormal findings    -  Primary     Elevated lipoprotein A level       Relevant Orders   CT CARDIAC SCORING (SELF PAY ONLY)   Ambulatory referral to Cardiology     Pure hypercholesterolemia       Relevant Orders   CT CARDIAC SCORING (SELF PAY ONLY)     Encounter for screening for other viral diseases       Relevant Orders   Hepatitis C antibody   HIV Antibody (routine testing w rflx)     EKG, abnormal  Relevant Orders   Ambulatory referral to Cardiology       Assessment and Plan Assessment & Plan Adult Wellness Visit Annual preventive health care physical exam conducted. Pap smear, mammogram, and colonoscopy are up to date. 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 No current concerns from OB GYN. Dental and dermatology visits are regular. No new concerns reported. - Continue regular screenings and preventive health measures. - Ensure vaccinations are up to date.  Pure hypercholesterolemia and elevated lipoprotein(a) Elevated lipoprotein(a) with significant cardiovascular risk. Discussed lifestyle modifications including diet  and exercise. Consideration of statin therapy due to elevated LDL and lipoprotein(a). Discussed potential use of Repatha if statins are not tolerated. Emphasized the importance of starting statin therapy to reduce cardiovascular risk. - Ordered CT coronary calcium scan. - work on diet and exercise.  - Performed EKG for baseline cardiac evaluation. - referral placed to cardiology for further evaluation and treatment   EKG shows NSR, rate 74, incomplete RBBB, no acute changes    Return in about 1 year (around 07/28/2025) for Fasting follow up 6 weeks after starting cholesterol therapy. SABRA Boby Mackintosh, NP-C

## 2024-07-28 NOTE — Patient Instructions (Signed)
 Preventive Care 58-54 Years Old, Female  Preventive care refers to lifestyle choices and visits with your health care provider that can promote health and wellness. Preventive care visits are also called wellness exams.  What can I expect for my preventive care visit?  Counseling  Your health care provider may ask you questions about your:  Medical history, including:  Past medical problems.  Family medical history.  Pregnancy history.  Current health, including:  Menstrual cycle.  Method of birth control.  Emotional well-being.  Home life and relationship well-being.  Sexual activity and sexual health.  Lifestyle, including:  Alcohol, nicotine or tobacco, and drug use.  Access to firearms.  Diet, exercise, and sleep habits.  Work and work Astron[employer].  Sunscreen use.  Safety issues such as seatbelt and bike helmet use.  Physical exam  Your health care provider will check your:  Height and weight. These may be used to calculate your BMI (body mass index). BMI is a measurement that tells if you are at a healthy weight.  Waist circumference. This measures the distance around your waistline. This measurement also tells if you are at a healthy weight and may help predict your risk of certain diseases, such as type 2 diabetes and high blood pressure.  Heart rate and blood pressure.  Body temperature.  Skin for abnormal spots.  What immunizations do I need?    Vaccines are usually given at various ages, according to a schedule. Your health care provider will recommend vaccines for you based on your age, medical history, and lifestyle or other factors, such as travel or where you work.  What tests do I need?  Screening  Your health care provider may recommend screening tests for certain conditions. This may include:  Lipid and cholesterol levels.  Diabetes screening. This is done by checking your blood sugar (glucose) after you have not eaten for a while (fasting).  Pelvic exam and Pap test.  Hepatitis B test.  Hepatitis C  test.  HIV (human immunodeficiency virus) test.  STI (sexually transmitted infection) testing, if you are at risk.  Lung cancer screening.  Colorectal cancer screening.  Mammogram. Talk with your health care provider about when you should start having regular mammograms. This may depend on whether you have a family history of breast cancer.  BRCA-related cancer screening. This may be done if you have a family history of breast, ovarian, tubal, or peritoneal cancers.  Bone density scan. This is done to screen for osteoporosis.  Talk with your health care provider about your test results, treatment options, and if necessary, the need for more tests.  Follow these instructions at home:  Eating and drinking    Eat a diet that includes fresh fruits and vegetables, whole grains, lean protein, and low-fat dairy products.  Take vitamin and mineral supplements as recommended by your health care provider.  Do not drink alcohol if:  Your health care provider tells you not to drink.  You are pregnant, may be pregnant, or are planning to become pregnant.  If you drink alcohol:  Limit how much you have to 0-1 drink a day.  Know how much alcohol is in your drink. In the [place] one drink equals one 12 oz bottle of beer (355 mL), one 5 oz glass of wine (148 mL), or one 1 oz glass of hard liquor (44 mL).  Lifestyle  Brush your teeth every morning and night with fluoride toothpaste. Floss one time each day.  Exercise for at least  30 minutes 5 or more days each week.  Do not use any products that contain nicotine or tobacco. These products include cigarettes, chewing tobacco, and vaping devices, such as e-cigarettes. If you need help quitting, ask your health care provider.  Do not use drugs.  If you are sexually active, practice safe sex. Use a condom or other form of protection to prevent STIs.  If you do not wish to become pregnant, use a form of birth control. If you plan to become pregnant, see your health care provider for a  prepregnancy visit.  Take aspirin only as told by your health care provider. Make sure that you understand how much to take and what form to take. Work with your health care provider to find out whether it is safe and beneficial for you to take aspirin daily.  Find healthy ways to manage stress, such as:  Meditation, yoga, or listening to music.  Journaling.  Talking to a trusted person.  Spending time with friends and family.  Minimize exposure to UV radiation to reduce your risk of skin cancer.  Safety  Always wear your seat belt while driving or riding in a vehicle.  Do not drive:  If you have been drinking alcohol. Do not ride with someone who has been drinking.  When you are tired or distracted.  While texting.  If you have been using any mind-altering substances or drugs.  Wear a helmet and other protective equipment during sports activities.  If you have firearms in your house, make sure you follow all gun safety procedures.  Seek help if you have been physically or sexually abused.  What's next?  Visit your health care provider once a year for an annual wellness visit.  Ask your health care provider how often you should have your eyes and teeth checked.  Stay up to date on all vaccines.  This information is not intended to replace advice given to you by your health care provider. Make sure you discuss any questions you have with your health care provider.  Document Revised: 02/19/2021 Document Reviewed: 02/19/2021  Elsevier Patient Education  2024 [employer].

## 2024-07-31 NOTE — Addendum Note (Signed)
 Addended by: Michayla Mcneil E on: 07/31/2024 09:02 AM   Modules accepted: Orders

## 2024-08-01 ENCOUNTER — Encounter: Payer: Self-pay | Admitting: Family Medicine

## 2024-08-02 ENCOUNTER — Ambulatory Visit (HOSPITAL_COMMUNITY)
Admission: RE | Admit: 2024-08-02 | Discharge: 2024-08-02 | Disposition: A | Discharge status: Home / Self Care | Payer: Self-pay | Source: Ambulatory Visit | Attending: Cardiology | Admitting: Cardiology

## 2024-08-02 DIAGNOSIS — E7841 Elevated Lipoprotein(a): Secondary | ICD-10-CM | POA: Insufficient documentation

## 2024-08-02 DIAGNOSIS — E7800 Pure hypercholesterolemia, unspecified: Secondary | ICD-10-CM | POA: Insufficient documentation

## 2024-08-03 ENCOUNTER — Ambulatory Visit: Payer: Self-pay | Admitting: Family Medicine

## 2024-08-08 ENCOUNTER — Other Ambulatory Visit (HOSPITAL_COMMUNITY): Payer: Self-pay

## 2024-08-14 ENCOUNTER — Other Ambulatory Visit (HOSPITAL_COMMUNITY): Payer: Self-pay

## 2024-08-14 ENCOUNTER — Other Ambulatory Visit: Payer: Self-pay | Admitting: Family Medicine

## 2024-08-14 ENCOUNTER — Other Ambulatory Visit: Payer: Self-pay

## 2024-08-14 DIAGNOSIS — H524 Presbyopia: Secondary | ICD-10-CM | POA: Diagnosis not present

## 2024-08-14 DIAGNOSIS — H52223 Regular astigmatism, bilateral: Secondary | ICD-10-CM | POA: Diagnosis not present

## 2024-08-14 DIAGNOSIS — H5213 Myopia, bilateral: Secondary | ICD-10-CM | POA: Diagnosis not present

## 2024-08-14 DIAGNOSIS — H33103 Unspecified retinoschisis, bilateral: Secondary | ICD-10-CM | POA: Diagnosis not present

## 2024-08-14 MED ORDER — BUPROPION HCL ER (SR) 150 MG PO TB12
150.0000 mg | ORAL_TABLET | Freq: Every day | ORAL | 1 refills | Status: AC
  Filled 2024-08-14: qty 90, 90d supply, fill #0

## 2024-08-16 ENCOUNTER — Other Ambulatory Visit (HOSPITAL_COMMUNITY): Payer: Self-pay

## 2024-08-16 MED ORDER — ESTRADIOL 0.1 MG/24HR TD PTTW
1.0000 | MEDICATED_PATCH | TRANSDERMAL | 0 refills | Status: AC
  Filled 2024-08-16 – 2024-08-18 (×2): qty 8, 28d supply, fill #0

## 2024-08-17 ENCOUNTER — Other Ambulatory Visit: Payer: Self-pay

## 2024-08-18 ENCOUNTER — Other Ambulatory Visit (HOSPITAL_COMMUNITY): Payer: Self-pay

## 2024-09-15 ENCOUNTER — Other Ambulatory Visit (HOSPITAL_COMMUNITY): Payer: Self-pay

## 2024-09-15 MED ORDER — ESTRADIOL 0.05 MG/24HR TD PTTW
1.0000 | MEDICATED_PATCH | TRANSDERMAL | 3 refills | Status: AC
  Filled 2024-09-15: qty 24, 84d supply, fill #0

## 2024-09-15 MED ORDER — PROGESTERONE MICRONIZED 100 MG PO CAPS
100.0000 mg | ORAL_CAPSULE | Freq: Every day | ORAL | 3 refills | Status: AC
  Filled 2024-09-15 – 2024-09-25 (×3): qty 90, 90d supply, fill #0

## 2024-09-18 ENCOUNTER — Other Ambulatory Visit (HOSPITAL_COMMUNITY): Payer: Self-pay

## 2024-09-19 ENCOUNTER — Other Ambulatory Visit: Payer: Self-pay

## 2024-09-19 ENCOUNTER — Other Ambulatory Visit (HOSPITAL_COMMUNITY): Payer: Self-pay

## 2024-09-20 ENCOUNTER — Other Ambulatory Visit (HOSPITAL_COMMUNITY): Payer: Self-pay

## 2024-09-25 ENCOUNTER — Other Ambulatory Visit: Payer: Self-pay

## 2024-09-25 ENCOUNTER — Other Ambulatory Visit (HOSPITAL_COMMUNITY): Payer: Self-pay

## 2024-11-01 ENCOUNTER — Ambulatory Visit: Admitting: Cardiology
